# Patient Record
Sex: Female | Born: 1935
Health system: Southern US, Community
[De-identification: ages and names within clinical notes are randomized; demographics above are authoritative.]

## PROBLEM LIST (undated history)

## (undated) DIAGNOSIS — I480 Paroxysmal atrial fibrillation: Secondary | ICD-10-CM

## (undated) DIAGNOSIS — I10 Essential (primary) hypertension: Secondary | ICD-10-CM

## (undated) DIAGNOSIS — E785 Hyperlipidemia, unspecified: Secondary | ICD-10-CM

## (undated) DIAGNOSIS — M8580 Other specified disorders of bone density and structure, unspecified site: Secondary | ICD-10-CM

## (undated) HISTORY — DX: Essential (primary) hypertension: I10

## (undated) HISTORY — DX: Other specified disorders of bone density and structure, unspecified site: M85.80

## (undated) HISTORY — DX: Hyperlipidemia, unspecified: E78.5

## (undated) HISTORY — PX: TONSILLECTOMY: SUR1361

## (undated) HISTORY — DX: Paroxysmal atrial fibrillation: I48.0

---

## 1998-06-19 ENCOUNTER — Other Ambulatory Visit: Admission: RE | Admit: 1998-06-19 | Discharge: 1998-06-19 | Payer: Self-pay | Admitting: Obstetrics and Gynecology

## 1999-03-06 ENCOUNTER — Encounter: Admission: RE | Admit: 1999-03-06 | Discharge: 1999-04-16 | Payer: Self-pay | Admitting: Internal Medicine

## 1999-09-03 ENCOUNTER — Other Ambulatory Visit: Admission: RE | Admit: 1999-09-03 | Discharge: 1999-09-03 | Payer: Self-pay | Admitting: Obstetrics and Gynecology

## 2000-09-26 ENCOUNTER — Other Ambulatory Visit: Admission: RE | Admit: 2000-09-26 | Discharge: 2000-09-26 | Payer: Self-pay | Admitting: Obstetrics and Gynecology

## 2004-03-26 ENCOUNTER — Ambulatory Visit: Payer: Self-pay | Admitting: Internal Medicine

## 2005-07-21 ENCOUNTER — Ambulatory Visit: Payer: Self-pay | Admitting: Internal Medicine

## 2005-07-28 ENCOUNTER — Ambulatory Visit: Payer: Self-pay | Admitting: Gastroenterology

## 2005-08-11 ENCOUNTER — Ambulatory Visit: Payer: Self-pay | Admitting: Gastroenterology

## 2005-08-11 ENCOUNTER — Encounter (INDEPENDENT_AMBULATORY_CARE_PROVIDER_SITE_OTHER): Payer: Self-pay | Admitting: Specialist

## 2005-08-11 ENCOUNTER — Encounter: Payer: Self-pay | Admitting: Family Medicine

## 2006-03-31 ENCOUNTER — Ambulatory Visit: Payer: Self-pay | Admitting: Internal Medicine

## 2006-06-22 ENCOUNTER — Ambulatory Visit: Payer: Self-pay | Admitting: Internal Medicine

## 2007-01-09 ENCOUNTER — Encounter: Payer: Self-pay | Admitting: Family Medicine

## 2007-04-28 ENCOUNTER — Ambulatory Visit: Payer: Self-pay | Admitting: Internal Medicine

## 2007-04-28 DIAGNOSIS — E1169 Type 2 diabetes mellitus with other specified complication: Secondary | ICD-10-CM | POA: Insufficient documentation

## 2007-04-28 DIAGNOSIS — M810 Age-related osteoporosis without current pathological fracture: Secondary | ICD-10-CM | POA: Insufficient documentation

## 2007-04-28 DIAGNOSIS — E785 Hyperlipidemia, unspecified: Secondary | ICD-10-CM

## 2007-04-28 LAB — CONVERTED CEMR LAB: TSH: 1.28 microintl units/mL (ref 0.35–5.50)

## 2007-05-02 LAB — CONVERTED CEMR LAB: Vit D, 1,25-Dihydroxy: 30 (ref 30–89)

## 2007-10-27 ENCOUNTER — Encounter: Payer: Self-pay | Admitting: Internal Medicine

## 2008-02-20 ENCOUNTER — Ambulatory Visit: Payer: Self-pay | Admitting: Internal Medicine

## 2009-01-22 ENCOUNTER — Encounter: Payer: Self-pay | Admitting: Internal Medicine

## 2010-01-28 ENCOUNTER — Ambulatory Visit: Payer: Self-pay | Admitting: Family Medicine

## 2010-01-28 DIAGNOSIS — G4700 Insomnia, unspecified: Secondary | ICD-10-CM | POA: Insufficient documentation

## 2010-01-28 LAB — HM SIGMOIDOSCOPY

## 2010-02-04 ENCOUNTER — Encounter: Payer: Self-pay | Admitting: Family Medicine

## 2010-02-05 LAB — HM MAMMOGRAPHY: HM Mammogram: NORMAL

## 2010-02-13 ENCOUNTER — Encounter (INDEPENDENT_AMBULATORY_CARE_PROVIDER_SITE_OTHER): Payer: Self-pay | Admitting: *Deleted

## 2010-02-25 ENCOUNTER — Encounter: Payer: Self-pay | Admitting: Family Medicine

## 2010-03-04 ENCOUNTER — Encounter: Payer: Self-pay | Admitting: Family Medicine

## 2010-04-16 ENCOUNTER — Ambulatory Visit: Payer: Self-pay | Admitting: Family Medicine

## 2010-04-17 LAB — CONVERTED CEMR LAB
ALT: 17 units/L (ref 0–35)
Alkaline Phosphatase: 39 units/L (ref 39–117)
Bilirubin, Direct: 0.1 mg/dL (ref 0.0–0.3)
CO2: 31 meq/L (ref 19–32)
Chloride: 93 meq/L — ABNORMAL LOW (ref 96–112)
Sodium: 133 meq/L — ABNORMAL LOW (ref 135–145)
Total CHOL/HDL Ratio: 3
Total Protein: 6.6 g/dL (ref 6.0–8.3)
Vit D, 25-Hydroxy: 43 ng/mL (ref 30–89)

## 2010-04-24 ENCOUNTER — Ambulatory Visit: Payer: Self-pay | Admitting: Family Medicine

## 2010-04-24 DIAGNOSIS — R7303 Prediabetes: Secondary | ICD-10-CM

## 2010-04-24 DIAGNOSIS — E118 Type 2 diabetes mellitus with unspecified complications: Secondary | ICD-10-CM | POA: Insufficient documentation

## 2010-04-24 DIAGNOSIS — E871 Hypo-osmolality and hyponatremia: Secondary | ICD-10-CM | POA: Insufficient documentation

## 2010-05-08 ENCOUNTER — Ambulatory Visit: Payer: Self-pay | Admitting: Family Medicine

## 2010-05-14 ENCOUNTER — Ambulatory Visit
Admission: RE | Admit: 2010-05-14 | Discharge: 2010-05-14 | Payer: Self-pay | Source: Home / Self Care | Attending: Family Medicine | Admitting: Family Medicine

## 2010-05-14 ENCOUNTER — Encounter: Payer: Self-pay | Admitting: Family Medicine

## 2010-05-14 LAB — CONVERTED CEMR LAB
BUN: 13 mg/dL (ref 6–23)
CO2: 32 meq/L (ref 19–32)
Chloride: 93 meq/L — ABNORMAL LOW (ref 96–112)
Creatinine, Ser: 0.6 mg/dL (ref 0.4–1.2)

## 2010-05-19 LAB — CONVERTED CEMR LAB: Osmolality, Ur: 289 mOsm/kg — ABNORMAL LOW (ref 390–1090)

## 2010-05-21 ENCOUNTER — Telehealth: Payer: Self-pay | Admitting: Family Medicine

## 2010-05-21 ENCOUNTER — Encounter: Payer: Self-pay | Admitting: Family Medicine

## 2010-05-21 ENCOUNTER — Ambulatory Visit
Admission: RE | Admit: 2010-05-21 | Discharge: 2010-05-21 | Payer: Self-pay | Source: Home / Self Care | Attending: Family Medicine | Admitting: Family Medicine

## 2010-06-18 NOTE — Letter (Signed)
Summary: Results Follow up Letter  [place] [facility]  [address] [place], [place] [address]   Phone: [phone]  Fax: [phone]    02/13/2010 MRN: [id number]     [facility] [address] [place], [place]  [address]    Dear Ms. Melecio,  The following are the results of your recent test(s):  Test         Result    Pap Smear:        Normal _____  Not Normal _____ Comments: ______________________________________________________ Cholesterol: LDL(Bad cholesterol):         Your goal is less than:         HDL (Good cholesterol):       Your goal is more than: Comments:  ______________________________________________________ Mammogram:        Normal _____  Not Normal _____ Comments:  ___________________________________________________________________ Hemoccult:        Normal _____  Not normal _______ Comments:    _____________________________________________________________________ Other Tests:Bone density: OSTEOPENIA: REPEAT IN 2 YEARS    We routinely do not discuss normal results over the telephone.  If you desire a copy of the results, or you have any questions about this information we can discuss them at your next office visit.   Sincerely,  Kerby Nora MD

## 2010-06-18 NOTE — Assessment & Plan Note (Signed)
Summary: cpx/dlo   Vital Signs:  Patient profile:   77 year old female Height:      63 inches Weight:      114.8 pounds BMI:     20.41 Temp:     97.9 degrees F oral Pulse rate:   84 / minute Pulse rhythm:   regular BP sitting:   110 / 70  (left arm) Cuff size:   regular  Vitals Entered By: Benny Lennert CMA Duncan Dull) (April 24, 2010 2:10 PM)  Vision Screening:      Vision Comments: wears glasses  Vision Entered By: Benny Lennert CMA Duncan Dull) (April 24, 2010 2:11 PM)  Hearing Screen  20db HL: Left  Right  Audiometry Comment: can hear whisper from across the room   Hearing Testing Entered By: Benny Lennert CMA Duncan Dull) (April 24, 2010 2:11 PM)   History of Present Illness: Chief complaint  I have personally reviewed the Medicare Annual Wellness questionnaire and have noted 1.   The patient's medical and social history 2.   Their use of alcohol, tobacco or illicit drugs 3.   Their current medications and supplements 4.   The patient's functional ability including ADL's, fall risks, home safety risks and hearing or visual             impairment. 5.   Diet and physical activities 6.   Evidence for depression or mood disorders   The patients weight, height, BMI and visual acuity have been recorded in the chart I have made referrals, counseling and provided education to the patient based review of the above and I have provided the pt with a written personalized care plan for preventive services.   Reviewed labs in detail with pt.   Low sodium  on labs.. will recheck.   She did fall several weeks ago..stepped wrong...mild ttp in left posterior neck   Preventive Screening-Counseling & Management  Alcohol-Tobacco     Alcohol drinks/day: 0     Smoking Status: never  Caffeine-Diet-Exercise     Diet Comments: healthy varied diet      Diet Counseling: not indicated; diet is assessed to be healthy     Type of exercise: stretching, weights, line dancing   Exercise (avg: min/session): 1 hour     Times/week: 5 days per week     Exercise Counseling: not indicated; exercise is adequate     Depression Counseling: not indicated; screening negative for depression  Problems Prior to Update: 1)  Abdominal Pain Other Specified Site  (ICD-789.09) 2)  Insomnia, Chronic  (ICD-307.42) 3)  Osteopenia  (ICD-733.90) 4)  Hyperlipidemia  (ICD-272.4)  Current Medications (verified): 1)  Caltrate 600+d 600-400 Mg-Unit  Tabs (Calcium Carbonate-Vitamin D) .... Two Times A Day 2)  Tylenol Extra Strength 500 Mg  Tabs (Acetaminophen) .... As Needed 3)  Alendronate Sodium 35 Mg Tabs (Alendronate Sodium) .Marland Kitchen.. 1 Tab By Mouth Week  Allergies: 1)  ! Penicillin V Potassium (Penicillin V Potassium) 2)  ! Doxycycline Hyclate (Doxycycline Hyclate) 3)  ! Erythromycin Base (Erythromycin Base)  Past History:  Past medical, surgical, family and social histories (including risk factors) reviewed, and no changes noted (except as noted below).  Past Medical History: Reviewed history from 04/28/2007 and no changes required. Hyperlipidemia Osteopenia  Past Surgical History: Reviewed history from 01/28/2010 and no changes required. Tonsillectomy  Family History: Reviewed history from 01/28/2010 and no changes required. fahter: emphesema, textile mills, CAD..MI age 40 mother: CVA age 64 brother: CAD.Marland Kitchenstent no cancer in  family  Social History: Reviewed history from 01/28/2010 and no changes required. Widow/Widower  Never Smoked Alcohol use-no Drug use-no Regular exercise-yes.Marland Kitchengoing to jazz class 4 days a week, daiyl walk Diet: fruits and veggies, milk, water  Review of Systems General:  Denies fatigue and fever. CV:  Denies chest pain or discomfort. Resp:  Denies shortness of breath. GI:  Denies abdominal pain, bloody stools, constipation, and diarrhea. GU:  Denies dysuria. Psych:  Denies anxiety and depression.  Physical Exam  General:  thin appearing  elderly female in NAd  Eyes:  No corneal or conjunctival inflammation noted. EOMI. Perrla. Funduscopic exam benign, without hemorrhages, exudates or papilledema. Vision grossly normal. Ears:  External ear exam shows no significant lesions or deformities.  Otoscopic examination reveals clear canals, tympanic membranes are intact bilaterally without bulging, retraction, inflammation or discharge. Hearing is grossly normal bilaterally. Nose:  External nasal examination shows no deformity or inflammation. Nasal mucosa are pink and moist without lesions or exudates. Mouth:  Oral mucosa and oropharynx without lesions or exudates.  Teeth in good repair. Neck:  no carotid bruit or thyromegaly no cervical or supraclavicular lymphadenopathy  Chest Wall:  No deformities, masses, or tenderness noted. Breasts:  No mass, nodules, thickening, tenderness, bulging, retraction, inflamation, nipple discharge or skin changes noted.   Lungs:  Normal respiratory effort, chest expands symmetrically. Lungs are clear to auscultation, no crackles or wheezes. Heart:  Normal rate and regular rhythm. S1 and S2 normal without gallop, murmur, click, rub or other extra sounds. Abdomen:  Bowel sounds positive,abdomen soft and non-tender without masses, organomegaly or hernias noted. Genitalia:  not indicated Msk:  No deformity or scoliosis noted of thoracic or lumbar spine.    Mild ttp over left trazezius, no cervical vertebra ttp Neg drop arm and impingement test Pulses:  R and L carotid,radial,femoral,dorsalis pedis and posterior tibial pulses are full and equal bilaterally Extremities:  no edema  Neurologic:  No cranial nerve deficits noted. Station and gait are normal. Plantar reflexes are down-going bilaterally. DTRs are symmetrical throughout. Sensory, motor and coordinative functions appear intact.  Cognitive eval: MMSE 30/30 Skin:  no worrisome lesions Psych:  Cognition and judgment appear intact. Alert and cooperative  with normal attention span and concentration. No apparent delusions, illusions, hallucinations    Impression & Recommendations:  Problem # 1:  Preventive Health Care (ICD-V70.0) .The patient's preventative maintenance and recommended screening tests for an annual wellness exam were reviewed in full today. Brought up to date unless services declined.  Counselled on the importance of diet, exercise, and its role in overall health and mortality. The patient's FH and SH was reviewed, including their home life, tobacco status, and drug and alcohol status.     Problem # 2:  PREDIABETES (ICD-790.29) Assessment: New  Encouraged exercise, weight loss, healthy eating habits. Recheck in 6 months.   Labs Reviewed: Creat: 0.7 (04/16/2010)     Problem # 3:  HYPERLIPIDEMIA (ICD-272.4)  Well controlled with diet and lifestyle. on n med.   Labs Reviewed: SGOT: 20 (04/16/2010)   SGPT: 17 (04/16/2010)   HDL:62.10 (04/16/2010)  LDL:114 (04/16/2010)  Chol:192 (04/16/2010)  Trig:81.0 (04/16/2010)  Problem # 4:  HYPONATREMIA, MILD (ICD-276.1) Uncelar cause.. recheck in 2 weeks.   Complete Medication List: 1)  Caltrate 600+d 600-400 Mg-unit Tabs (Calcium carbonate-vitamin d) .... Two times a day 2)  Tylenol Extra Strength 500 Mg Tabs (Acetaminophen) .... As needed 3)  Alendronate Sodium 35 Mg Tabs (Alendronate sodium) .Marland Kitchen.. 1 tab by mouth week  Other Orders: Medicare -1st Annual Wellness Visit [phone]) Pneumococcal Vaccine (62952) Admin 1st Vaccine (84132) Zoster (Shingles) Vaccine Live [phone]) Admin of Any Addtl Vaccine (27253)  Patient Instructions: 1)  I have provided you with a copy of your personalized plan for preventive services. Please take the time to review along with your updated medication list.  2)   Stop salt restriction. 3)  Return for BMET in 2 weeks Dx 276.1 4)   Decrease sweets and carbs in diet, decrease juice. 5)  Please schedule a follow-up appointment in 6 months .  6)   BMP prior to visit, ICD-9: 790.29 7)  Hepatic Panel prior to visit ICD-9:    Orders Added: 1)  Medicare -1st Annual Wellness Visit [G0438] 2)  Est. Patient Level III [66440] 3)  Pneumococcal Vaccine [90732] 4)  Admin 1st Vaccine [90471] 5)  Zoster (Shingles) Vaccine Live [90736] 6)  Admin of Any Addtl Vaccine [34742]   Immunizations Administered:  Pneumonia Vaccine:    Vaccine Type: Pneumovax (Medicare)    Site: left deltoid    Mfr: Merck    Dose: 0.5 ml    Route: IM    Given by: Benny Lennert CMA (AAMA)    Exp. Date: 09/11/2011    Lot #: [id number]    VIS given: 04/21/09 version given April 24, 2010.  Zostavax # 1:    Vaccine Type: Zostavax    Site: right deltoid    Mfr: Merck    Dose: 0.5 ml    Route: IM    Given by: Benny Lennert CMA (AAMA)    Exp. Date: 01/02/2011    Lot #: [id number]    VIS given: 02/26/05 given April 24, 2010.   Immunizations Administered:  Pneumonia Vaccine:    Vaccine Type: Pneumovax (Medicare)    Site: left deltoid    Mfr: Merck    Dose: 0.5 ml    Route: IM    Given by: Benny Lennert CMA (AAMA)    Exp. Date: 09/11/2011    Lot #: [id number]    VIS given: 04/21/09 version given April 24, 2010.  Zostavax # 1:    Vaccine Type: Zostavax    Site: right deltoid    Mfr: Merck    Dose: 0.5 ml    Route: IM    Given by: Benny Lennert CMA (AAMA)    Exp. Date: 01/02/2011    Lot #: [id number]    VIS given: 02/26/05 given April 24, 2010.  Current Allergies (reviewed today): ! PENICILLIN V POTASSIUM (PENICILLIN V POTASSIUM) ! DOXYCYCLINE HYCLATE (DOXYCYCLINE HYCLATE) ! ERYTHROMYCIN BASE (ERYTHROMYCIN BASE)  Hemoccult Next Due:  Not Indicated PAP Next Due:  Not Indicated Last Bone Density:  stable osteoporosis on bisphosphonate (03/15/2010 10:10:55 PM) Bone Density Result Date:  04/17/2010 Bone Density Result:  Corrrection..stable osteoPENIA

## 2010-06-18 NOTE — Assessment & Plan Note (Signed)
Summary: TRANSFER FROM [facility]   Vital Signs:  Patient profile:   77 year old female Height:      63 inches Weight:      111 pounds BMI:     19.73 Temp:     99.0 degrees F oral Pulse rate:   84 / minute Pulse rhythm:   regular BP sitting:   120 / 60  (left arm) Cuff size:   regular  Vitals Entered By: Benny Lennert CMA Duncan Dull) (January 28, 2010 9:40 AM)  History of Present Illness: Chief complaint new patient transfer from [place]  Recently moved to area from [place].  High cholesterol.. not checked in a while  Osteopenia seen on DXA last year...started on fosamax 1 year ago...not sure when last DXA. Had seen Dr. Ambrose Mantle in [place] for GYN.  Preventive Screening-Counseling & Management  Alcohol-Tobacco     Smoking Status: never  Caffeine-Diet-Exercise     Does Patient Exercise: yes      Drug Use:  no.    Problems Prior to Update: 1)  Osteopenia  (ICD-733.90) 2)  Hyperlipidemia  (ICD-272.4)  Allergies: 1)  ! Penicillin V Potassium (Penicillin V Potassium) 2)  ! Doxycycline Hyclate (Doxycycline Hyclate) 3)  ! Erythromycin Base (Erythromycin Base)  Past History:  Past medical, surgical, family and social histories (including risk factors) reviewed, and no changes noted (except as noted below).  Past Medical History: Reviewed history from 04/28/2007 and no changes required. Hyperlipidemia Osteopenia  Past Surgical History: Tonsillectomy  Family History: Reviewed history and no changes required. fahter: emphesema, textile mills, CAD..MI age 85 mother: CVA age 84 brother: CAD.Marland Kitchenstent no cancer in family  Social History: Reviewed history and no changes required. Widow/Widower  Never Smoked Alcohol use-no Drug use-no Regular exercise-yes.Marland Kitchengoing to jazz class 4 days a week, daiyl walk Diet: fruits and veggies, milk, water Drug Use:  no Does Patient Exercise:  yes  Review of Systems General:  Denies fever. ENT:  Complains of ear discharge  and nasal congestion. CV:  Denies chest pain or discomfort. Resp:  Denies shortness of breath. GI:  Denies abdominal pain, bloody stools, and indigestion. GU:  Denies dysuria; occ itchy spot vaginally in last few weeks. Marland Kitchen  Physical Exam  General:  elderly female IN NAD Eyes:  No corneal or conjunctival inflammation noted. EOMI. Perrla. Funduscopic exam benign, without hemorrhages, exudates or papilledema. Vision grossly normal. Ears:  External ear exam shows no significant lesions or deformities.  Otoscopic examination reveals clear canals, tympanic membranes are intact bilaterally without bulging, retraction, inflammation or discharge. Hearing is grossly normal bilaterally. Nose:  External nasal examination shows no deformity or inflammation. Nasal mucosa are pink and moist without lesions or exudates. Mouth:  Oral mucosa and oropharynx without lesions or exudates.  Teeth in moderate repair Neck:  no carotid bruit or thyromegaly no cervical or supraclavicular lymphadenopathy  Lungs:  Normal respiratory effort, chest expands symmetrically. Lungs are clear to auscultation, no crackles or wheezes. Heart:  Normal rate and regular rhythm. S1 and S2 normal without gallop, murmur, click, rub or other extra sounds. Abdomen:  Bowel sounds positive,abdomen soft and non-tender without masses, organomegaly or hernias noted. Pulses:  R and L posterior tibial pulses are full and equal bilaterally  Extremities:  no edema Skin:  Intact without suspicious lesions or rashes Psych:  Cognition and judgment appear intact. Alert and cooperative with normal attention span and concentration. No apparent delusions, illusions, hallucinations   Impression & Recommendations:  Problem # 1:  OSTEOPENIA (ICD-733.90)  Will try to get last DXA result from Dr. Ambrose Mantle...continue fosamax 35 mg daily...likely continue until 2 year check due to reeval.  Her updated medication list for this problem includes:    Caltrate 600+d  600-400 Mg-unit Tabs (Calcium carbonate-vitamin d) .Marland Kitchen..Marland Kitchen Two times a day    Vitamin D 82956 Unit Caps (Ergocalciferol) ..... One by mouth weekly    Alendronate Sodium 35 Mg Tabs (Alendronate sodium) .Marland Kitchen... 1 tab by mouth week  Problem # 2:  HYPERLIPIDEMIA (ICD-272.4) Due for reeval.   Problem # 3:  INSOMNIA, CHRONIC (ICD-307.42) Counseled on healthy sleep, info given, start melatonin...stop benadryl for sleep given inefective and associated SE.   Problem # 4:  Preventive Health Care (ICD-V70.0) Assessment: Comment Only Obtain old records to determine when last colonoscpy, ? next due, last DXA and if she has had pneumovax.  Complete Medication List: 1)  Caltrate 600+d 600-400 Mg-unit Tabs (Calcium carbonate-vitamin d) .... Two times a day 2)  Tylenol Extra Strength 500 Mg Tabs (Acetaminophen) .... As needed 3)  Vitamin D 21308 Unit Caps (Ergocalciferol) .... One by mouth weekly 4)  Alendronate Sodium 35 Mg Tabs (Alendronate sodium) .Marland Kitchen.. 1 tab by mouth week  Patient Instructions: 1)  Fasting LIPIDs, CMET  Dx 272.0, TSH, vit B12 789.09, vit D Dx 733.0 2)   Schedule CPX in next 1-2 months. 3)   Call insurance first about shingle vaccine. 4)   Look into you records about colonoscopy and pneumonia vaccine. 5)   May melatonin 3 mg for sleep. 6)   Stop other sleep aid. 7)   Look into sleep hygeine information.   Prescriptions: ALENDRONATE SODIUM 35 MG TABS (ALENDRONATE SODIUM) 1 tab by mouth week  #12 x 0   Entered and Authorized by:   Kerby Nora MD   Signed by:   Kerby Nora MD on 01/28/2010   Method used:   Electronically to        [facility]. [address] [phone]* (retail)       [address] [place], [place]  [id number]       Ph: [phone]       Fax: [phone]   RxID:   [id number]   Current Allergies (reviewed today): ! PENICILLIN V POTASSIUM (PENICILLIN V POTASSIUM) ! DOXYCYCLINE HYCLATE (DOXYCYCLINE HYCLATE) ! ERYTHROMYCIN BASE (ERYTHROMYCIN BASE)  Last Flu  Vaccine:  Fluvax 3+ (02/20/2008 3:55:37 PM) Flu Vaccine Result Date:  01/28/2010 Flu Vaccine Result:  given Flu Vaccine Next Due:  1 yr Flex Sig Next Due:  Not Indicated Colonoscopy Result Date:  05/17/2006 Colonoscopy Result:  normal Mammogram Result Date:  01/15/2009 Mammogram Result:  normal Mammogram Next Due:  1 yr    Past Medical History:    Reviewed history from 04/28/2007 and no changes required:       Hyperlipidemia       Osteopenia  Past Surgical History:    Reviewed history and no changes required:       Tonsillectomy      Appended Document: Orders Update    Clinical Lists Changes  Observations: Added new observation of COLONNXTDUE: 08/12/2010 (01/28/2010 16:40) Removed observation of COLONOSCOPY: normal (05/17/2006 10:46) Added new observation of LST COLON DT: 08/11/2005 (08/11/2005 16:41) Added new observation of COLONOSCOPY: adenomatous polyps (08/11/2005 16:41)      Colonoscopy Result Date:  08/11/2005 Colonoscopy Result:  adenomatous polyps Colonoscopy Next Due:  5 yr

## 2010-06-18 NOTE — Progress Notes (Signed)
Summary: swelling in her legs  Phone Note Call from Patient Call back at Home Phone [phone]   Caller: Patient Call For: Meredith Nora MD Summary of Call: Patient is calling to let you know that she has had some swelling in her legs. She says that she was told by Dr. Ermalene Searing to call if she noticed any swelling.  Initial call taken by: Melody Comas,  May 21, 2010 11:48 AM  Follow-up for Phone Call        I am not sure how to triage this -- Dr. Ermalene Searing is in town today, back to work tomororw. I am going to forward to her, I don't think it is emergent at least.  Follow-up by: Hannah Beat MD,  May 21, 2010 11:54 AM  Additional Follow-up for Phone Call Additional follow up Details #1::        Noted. Will review lab results today and get back to her by end of day.  Additional Follow-up by: Meredith Nora MD,  May 22, 2010 8:24 AM

## 2010-06-18 NOTE — Letter (Signed)
Summary: Nature conservation officer [employer] Wellness Visit Questionnaire   [employer] Annual Wellness Visit Questionnaire   Imported By: Beau Fanny 04/27/2010 10:49:38  _____________________________________________________________________  External Attachment:    Type:   Image     Comment:   External Document

## 2010-06-18 NOTE — Procedures (Signed)
Summary: Colonoscopy by Dr.Malcolm Stark,[facility]  Colonoscopy by Dr.Malcolm Stark,[facility]   Imported By: Beau Fanny 01/29/2010 09:23:54  _____________________________________________________________________  External Attachment:    Type:   Image     Comment:   External Document

## 2010-07-29 ENCOUNTER — Encounter: Payer: Self-pay | Admitting: Gastroenterology

## 2010-08-04 NOTE — Letter (Signed)
Summary: Colonoscopy Letter  [facility]  [address] [place], [place] [address]   Phone: [phone]  Fax: [phone]      July 29, 2010 MRN: [id number]   [facility] [address] [place], [place]  [address]   Dear Meredith Kramer,   According to your medical record, it is time for you to schedule a Colonoscopy. The American Cancer Society recommends this procedure as a method to detect early colon cancer. Patients with a family history of colon cancer, or a personal history of colon polyps or inflammatory bowel disease are at increased risk.  This letter has been generated based on the recommendations made at the time of your procedure. If you feel that in your particular situation this may no longer apply, please contact our office.  Please call our office at [phone] to schedule this appointment or to update your records at your earliest convenience.  Thank you for cooperating with [place] to provide you with the very best care possible.   Sincerely,  Judie Petit T. Russella Dar, M.D.  [facility] [phone]

## 2010-09-01 ENCOUNTER — Other Ambulatory Visit: Payer: Self-pay | Admitting: Family Medicine

## 2010-10-20 ENCOUNTER — Other Ambulatory Visit (INDEPENDENT_AMBULATORY_CARE_PROVIDER_SITE_OTHER): Payer: Medicare Other | Admitting: Family Medicine

## 2010-10-20 DIAGNOSIS — R7309 Other abnormal glucose: Secondary | ICD-10-CM

## 2010-10-20 LAB — BASIC METABOLIC PANEL
BUN: 16 mg/dL (ref 6–23)
Chloride: 99 mEq/L (ref 96–112)
Creatinine, Ser: 0.7 mg/dL (ref 0.4–1.2)

## 2010-10-20 LAB — HEPATIC FUNCTION PANEL
ALT: 14 U/L (ref 0–35)
Albumin: 4.1 g/dL (ref 3.5–5.2)
Total Bilirubin: 0.7 mg/dL (ref 0.3–1.2)

## 2010-10-24 ENCOUNTER — Encounter: Payer: Self-pay | Admitting: Family Medicine

## 2010-10-27 ENCOUNTER — Encounter: Payer: Self-pay | Admitting: Family Medicine

## 2010-10-27 ENCOUNTER — Ambulatory Visit (INDEPENDENT_AMBULATORY_CARE_PROVIDER_SITE_OTHER): Payer: Medicare Other | Admitting: Family Medicine

## 2010-10-27 DIAGNOSIS — R7309 Other abnormal glucose: Secondary | ICD-10-CM

## 2010-10-27 DIAGNOSIS — I8390 Asymptomatic varicose veins of unspecified lower extremity: Secondary | ICD-10-CM

## 2010-10-27 DIAGNOSIS — E871 Hypo-osmolality and hyponatremia: Secondary | ICD-10-CM

## 2010-10-27 DIAGNOSIS — E785 Hyperlipidemia, unspecified: Secondary | ICD-10-CM

## 2010-10-27 NOTE — Patient Instructions (Addendum)
Wear compression hose 15-30 mmHG on legs. Elevate legs above heart. Regular exercise. Stop orange juice, can drink no sugar added cranberry juice occasionally. Avoid sweets, decrease carbohydrates. Eat lean protein and a lot of leafy green veggies.

## 2010-10-27 NOTE — Assessment & Plan Note (Signed)
Due for reeval in 6 months, last check well controlled

## 2010-10-27 NOTE — Assessment & Plan Note (Signed)
Resolved. Serum osmolality was normal.. So was likely an artifact.

## 2010-10-27 NOTE — Assessment & Plan Note (Signed)
Elevated feet, exercise, compression hose.

## 2010-10-27 NOTE — Progress Notes (Signed)
  Subjective:    Patient ID: Meredith Kramer, female    DOB: [birth year], 77 y.o.   MRN: [id number]  HPI  Feeling well overall.   Elevated Cholesterol: At goal last check   B lower legs swelling.Marland Kitchen Has improved some. She has significant varicose veins.  Hyponatremia, resolved.  Prediabetes, continued, slightly worse. Eats some carbs, drinks crystal light.  OJ every morning.   Review of Systems  Constitutional: Negative for fever and fatigue.  HENT: Negative for ear pain.   Eyes: Negative for pain.  Respiratory: Negative for shortness of breath.   Cardiovascular: Negative for chest pain, palpitations and leg swelling.  Gastrointestinal: Negative for abdominal pain, diarrhea and constipation.  Genitourinary: Negative for dysuria.  Neurological: Positive for headaches.       Sharp pain momentary  Few days ago, none now       Objective:   Physical Exam  Constitutional: Vital signs are normal. She appears well-developed and well-nourished. She is cooperative.  Non-toxic appearance. She does not appear ill. No distress.  HENT:  Head: Normocephalic.  Right Ear: Hearing, tympanic membrane, external ear and ear canal normal. Tympanic membrane is not erythematous, not retracted and not bulging.  Left Ear: Hearing, tympanic membrane, external ear and ear canal normal. Tympanic membrane is not erythematous, not retracted and not bulging.  Nose: No mucosal edema or rhinorrhea. Right sinus exhibits no maxillary sinus tenderness and no frontal sinus tenderness. Left sinus exhibits no maxillary sinus tenderness and no frontal sinus tenderness.  Mouth/Throat: Uvula is midline, oropharynx is clear and moist and mucous membranes are normal.  Eyes: Conjunctivae, EOM and lids are normal. Pupils are equal, round, and reactive to light. No foreign bodies found.  Neck: Trachea normal and normal range of motion. Neck supple. Carotid bruit is not present. No mass and no thyromegaly present.    Cardiovascular: Normal rate, regular rhythm, S1 normal, S2 normal, normal heart sounds, intact distal pulses and normal pulses.  Exam reveals no gallop and no friction rub.   No murmur heard.      No current peripheral edema B moderate varicose veins on feet, calves and thighs  Pulmonary/Chest: Effort normal and breath sounds normal. Not tachypneic. No respiratory distress. She has no decreased breath sounds. She has no wheezes. She has no rhonchi. She has no rales.  Abdominal: Soft. Normal appearance and bowel sounds are normal. There is no tenderness.  Neurological: She is alert.  Skin: Skin is warm, dry and intact. No rash noted.  Psychiatric: Her speech is normal and behavior is normal. Judgment and thought content normal. Her mood appears not anxious. Cognition and memory are normal. She does not exhibit a depressed mood.          Assessment & Plan:

## 2010-10-27 NOTE — Assessment & Plan Note (Signed)
Worsened control. Counsled on lifestyle and diet changes. Recehck in 6 months.

## 2010-11-30 ENCOUNTER — Other Ambulatory Visit: Payer: Self-pay | Admitting: Family Medicine

## 2011-03-02 ENCOUNTER — Other Ambulatory Visit: Payer: Self-pay | Admitting: Family Medicine

## 2011-04-26 ENCOUNTER — Other Ambulatory Visit (INDEPENDENT_AMBULATORY_CARE_PROVIDER_SITE_OTHER): Payer: Medicare Other

## 2011-04-26 ENCOUNTER — Telehealth: Payer: Self-pay | Admitting: Family Medicine

## 2011-04-26 DIAGNOSIS — E785 Hyperlipidemia, unspecified: Secondary | ICD-10-CM

## 2011-04-26 DIAGNOSIS — R7309 Other abnormal glucose: Secondary | ICD-10-CM

## 2011-04-26 LAB — COMPREHENSIVE METABOLIC PANEL
AST: 17 U/L (ref 0–37)
Albumin: 4 g/dL (ref 3.5–5.2)
Alkaline Phosphatase: 39 U/L (ref 39–117)
BUN: 12 mg/dL (ref 6–23)
Creatinine, Ser: 0.7 mg/dL (ref 0.4–1.2)
Potassium: 4.1 mEq/L (ref 3.5–5.1)
Total Bilirubin: 0.5 mg/dL (ref 0.3–1.2)

## 2011-04-26 LAB — LIPID PANEL
HDL: 57.9 mg/dL (ref >39.00)
LDL Cholesterol: 108 mg/dL — ABNORMAL HIGH (ref 0–99)
Total CHOL/HDL Ratio: 3
Triglycerides: 111 mg/dL (ref 0.0–149.0)
VLDL: 22.2 mg/dL (ref 0.0–40.0)

## 2011-04-26 NOTE — Telephone Encounter (Signed)
Message copied by Excell Seltzer on Mon Apr 26, 2011  9:13 AM ------      Message from: Alvina Chou      Created: Tue Apr 20, 2011 11:14 AM      Regarding: orders for 12-10       Patient is scheduled for CPX labs, please order future labs, Thanks , Camelia Eng

## 2011-04-30 ENCOUNTER — Encounter: Payer: Medicare Other | Admitting: Family Medicine

## 2011-05-14 ENCOUNTER — Encounter: Payer: Medicare Other | Admitting: Family Medicine

## 2011-05-26 ENCOUNTER — Encounter: Payer: Self-pay | Admitting: Family Medicine

## 2011-05-26 ENCOUNTER — Ambulatory Visit (INDEPENDENT_AMBULATORY_CARE_PROVIDER_SITE_OTHER): Payer: Medicare Other | Admitting: Family Medicine

## 2011-05-26 VITALS — BP 110/60 | HR 60 | Temp 97.4°F | Ht 63.0 in | Wt 115.5 lb

## 2011-05-26 DIAGNOSIS — M899 Disorder of bone, unspecified: Secondary | ICD-10-CM | POA: Diagnosis not present

## 2011-05-26 DIAGNOSIS — R7309 Other abnormal glucose: Secondary | ICD-10-CM | POA: Diagnosis not present

## 2011-05-26 DIAGNOSIS — E785 Hyperlipidemia, unspecified: Secondary | ICD-10-CM | POA: Diagnosis not present

## 2011-05-26 DIAGNOSIS — Z0000 Encounter for general adult medical examination without abnormal findings: Secondary | ICD-10-CM

## 2011-05-26 NOTE — Assessment & Plan Note (Signed)
Well controlled with diet. 

## 2011-05-26 NOTE — Progress Notes (Signed)
Subjective:    Patient ID: Meredith Kramer, female    DOB: January 30, 1930, 78 y.o.   MRN: [id number]  HPI I have personally reviewed the Medicare Annual Wellness questionnaire and have noted 1. The patient's medical and social history 2. Their use of alcohol, tobacco or illicit drugs 3. Their current medications and supplements 4. The patient's functional ability including ADL's, fall risks, home safety risks and hearing or visual             impairment. 5. Diet and physical activities 6. Evidence for depression or mood disorders The patients weight, height, BMI and visual acuity have been recorded in the chart I have made referrals, counseling and provided education to the patient based review of the above and I have provided the pt with a written personalized care plan for preventive services.  ADLs on her own. No assitive device.  Prediabetes: Inadequate control.. Glucose fasting at 118  Elevated Cholesterol:  LDL at goal <130 on no medicaiton. 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 Other complaints:  Mild hyponatremia has returned. On no med that causes.     Review of Systems  Constitutional: Negative for fever, fatigue and unexpected weight change.  HENT: Negative for ear pain, congestion, sore throat, sneezing, trouble swallowing and sinus pressure.   Eyes: Negative for pain and itching.  Respiratory: Negative for cough, shortness of breath and wheezing.   Cardiovascular: Negative for chest pain, palpitations and leg swelling.  Gastrointestinal: Negative for nausea, abdominal pain, diarrhea, constipation and blood in stool.  Genitourinary: Negative for dysuria, hematuria, vaginal discharge, difficulty urinating and menstrual problem.  Skin: Negative for rash.  Neurological: Negative for syncope, weakness, light-headedness, numbness and headaches.       No  recent falls  Psychiatric/Behavioral: Negative for confusion and dysphoric mood. The patient is not nervous/anxious.    Clearing throat, mild cough... No heartburn, mild runny nose no allergy symptoms. Not interesetd in treatment.    Objective:   Physical Exam  Constitutional: Vital signs are normal. She appears well-developed and well-nourished. She is cooperative.  Non-toxic appearance. She does not appear ill. No distress.       Elderly thin appearing female in NAD.  HENT:  Head: Normocephalic.  Right Ear: Hearing, tympanic membrane, external ear and ear canal normal.  Left Ear: Hearing, tympanic membrane, external ear and ear canal normal.  Nose: Nose normal.  Eyes: Conjunctivae, EOM and lids are normal. Pupils are equal, round, and reactive to light. No foreign bodies found.  Neck: Trachea normal and normal range of motion. Neck supple. Carotid bruit is not present. No mass and no thyromegaly present.  Cardiovascular: Normal rate, regular rhythm, S1 normal, S2 normal, normal heart sounds and intact distal pulses.  Exam reveals no gallop.   No murmur heard. Pulmonary/Chest: Effort normal and breath sounds normal. No respiratory distress. She has no wheezes. She has no rhonchi. She has no rales.  Abdominal: Soft. Normal appearance and bowel sounds are normal. She exhibits no distension, no fluid wave, no abdominal bruit and no mass. There is no hepatosplenomegaly. There is no tenderness. There is no rebound, no guarding and no CVA tenderness. No hernia.  Genitourinary: No breast swelling, tenderness, discharge or bleeding. Pelvic exam was performed with patient prone.  Lymphadenopathy:    She has no cervical adenopathy.    She has no axillary adenopathy.  Neurological: She is  alert. She has normal strength. No cranial nerve deficit or sensory deficit.  Skin: Skin is warm, dry and intact. No rash noted.  Psychiatric: Her speech is normal and behavior is normal. Judgment normal. Her mood  appears not anxious. Cognition and memory are normal. She does not exhibit a depressed mood.          Assessment & Plan:  AMW: The patient's preventative maintenance and recommended screening tests for an annual wellness exam were reviewed in full today. Brought up to date unless services declined.  Counselled on the importance of diet, exercise, and its role in overall health and mortality. The patient's FH and SH was reviewed, including their home life, tobacco status, and drug and alcohol status.   Vaccines: uptodate with Td, PNA and shingles vaccines, Flu 02/2011  DEXA: Osteopenia on fosamax stable 02/2010  Mammo: nml 01/2010.. Due for repeat Colon: 07/2005 Dr. Russella Dar...adenomatous polyps... Repeat due now Pap/DVE: not indicated Nonsmoker

## 2011-05-26 NOTE — Patient Instructions (Addendum)
Work on low carbohydrate diet... Increase fiber, use whole wheat bread.  Okay to use crystal light. Avoid fruit juice. Don't forget to call [facility] to schedule mammogram. NO bone density needed. Colon cancer screening: call Dr. Ardell Isaacs office to schedule.

## 2011-05-26 NOTE — Assessment & Plan Note (Signed)
Inadequate control...discussed diet changes in detail. Continue regular exercise.

## 2011-05-26 NOTE — Assessment & Plan Note (Signed)
On fosamax 3-4 years...stable DEXA 2011, consider stopping fosamax in 2014.

## 2011-06-15 ENCOUNTER — Other Ambulatory Visit: Payer: Self-pay | Admitting: Family Medicine

## 2011-09-07 DIAGNOSIS — H04129 Dry eye syndrome of unspecified lacrimal gland: Secondary | ICD-10-CM | POA: Diagnosis not present

## 2011-09-07 DIAGNOSIS — H01009 Unspecified blepharitis unspecified eye, unspecified eyelid: Secondary | ICD-10-CM | POA: Diagnosis not present

## 2011-09-07 DIAGNOSIS — I10 Essential (primary) hypertension: Secondary | ICD-10-CM | POA: Diagnosis not present

## 2011-09-07 DIAGNOSIS — H3500 Unspecified background retinopathy: Secondary | ICD-10-CM | POA: Diagnosis not present

## 2011-09-09 ENCOUNTER — Other Ambulatory Visit: Payer: Self-pay | Admitting: Family Medicine

## 2012-02-02 ENCOUNTER — Encounter: Payer: Self-pay | Admitting: Gastroenterology

## 2012-02-17 DIAGNOSIS — Z1231 Encounter for screening mammogram for malignant neoplasm of breast: Secondary | ICD-10-CM | POA: Diagnosis not present

## 2012-02-18 ENCOUNTER — Encounter: Payer: Self-pay | Admitting: Family Medicine

## 2012-02-22 ENCOUNTER — Encounter: Payer: Self-pay | Admitting: Family Medicine

## 2012-03-01 DIAGNOSIS — Z23 Encounter for immunization: Secondary | ICD-10-CM | POA: Diagnosis not present

## 2012-09-05 DIAGNOSIS — H04129 Dry eye syndrome of unspecified lacrimal gland: Secondary | ICD-10-CM | POA: Diagnosis not present

## 2012-09-05 DIAGNOSIS — H43819 Vitreous degeneration, unspecified eye: Secondary | ICD-10-CM | POA: Diagnosis not present

## 2012-09-05 DIAGNOSIS — H01009 Unspecified blepharitis unspecified eye, unspecified eyelid: Secondary | ICD-10-CM | POA: Diagnosis not present

## 2012-09-05 DIAGNOSIS — H2510 Age-related nuclear cataract, unspecified eye: Secondary | ICD-10-CM | POA: Diagnosis not present

## 2012-11-07 ENCOUNTER — Other Ambulatory Visit: Payer: Self-pay | Admitting: Family Medicine

## 2012-11-24 ENCOUNTER — Other Ambulatory Visit (INDEPENDENT_AMBULATORY_CARE_PROVIDER_SITE_OTHER): Payer: Medicare Other

## 2012-11-24 ENCOUNTER — Telehealth: Payer: Self-pay | Admitting: Family Medicine

## 2012-11-24 DIAGNOSIS — M899 Disorder of bone, unspecified: Secondary | ICD-10-CM | POA: Diagnosis not present

## 2012-11-24 DIAGNOSIS — E871 Hypo-osmolality and hyponatremia: Secondary | ICD-10-CM | POA: Diagnosis not present

## 2012-11-24 DIAGNOSIS — R7309 Other abnormal glucose: Secondary | ICD-10-CM

## 2012-11-24 DIAGNOSIS — E785 Hyperlipidemia, unspecified: Secondary | ICD-10-CM | POA: Diagnosis not present

## 2012-11-24 DIAGNOSIS — M949 Disorder of cartilage, unspecified: Secondary | ICD-10-CM | POA: Diagnosis not present

## 2012-11-24 LAB — COMPREHENSIVE METABOLIC PANEL
Alkaline Phosphatase: 39 U/L (ref 39–117)
BUN: 13 mg/dL (ref 6–23)
CO2: 33 mEq/L — ABNORMAL HIGH (ref 19–32)
GFR: 89.86 mL/min (ref >60.00)
Glucose, Bld: 114 mg/dL — ABNORMAL HIGH (ref 70–99)
Total Bilirubin: 0.6 mg/dL (ref 0.3–1.2)

## 2012-11-24 LAB — LIPID PANEL
Cholesterol: 192 mg/dL (ref 0–200)
LDL Cholesterol: 112 mg/dL — ABNORMAL HIGH (ref 0–99)
Triglycerides: 84 mg/dL (ref 0.0–149.0)
VLDL: 16.8 mg/dL (ref 0.0–40.0)

## 2012-11-24 NOTE — Telephone Encounter (Signed)
Message copied by Kerby Nora E on Fri Nov 24, 2012 12:53 AM ------      Message from: Alvina Chou      Created: Wed Nov 15, 2012 11:42 AM      Regarding: Lab orders for Friday, 7.11.14       Patient is scheduled for CPX labs, please order future labs, Thanks , Meredith Kramer       ------

## 2012-12-01 ENCOUNTER — Ambulatory Visit (INDEPENDENT_AMBULATORY_CARE_PROVIDER_SITE_OTHER): Payer: Medicare Other | Admitting: Family Medicine

## 2012-12-01 ENCOUNTER — Encounter: Payer: Self-pay | Admitting: Family Medicine

## 2012-12-01 VITALS — BP 120/70 | HR 67 | Temp 97.9°F | Ht 63.0 in | Wt 116.5 lb

## 2012-12-01 DIAGNOSIS — M949 Disorder of cartilage, unspecified: Secondary | ICD-10-CM

## 2012-12-01 DIAGNOSIS — M899 Disorder of bone, unspecified: Secondary | ICD-10-CM

## 2012-12-01 DIAGNOSIS — Z0000 Encounter for general adult medical examination without abnormal findings: Secondary | ICD-10-CM

## 2012-12-01 DIAGNOSIS — R7309 Other abnormal glucose: Secondary | ICD-10-CM

## 2012-12-01 DIAGNOSIS — E785 Hyperlipidemia, unspecified: Secondary | ICD-10-CM

## 2012-12-01 NOTE — Assessment & Plan Note (Signed)
Well controlled on no med. 

## 2012-12-01 NOTE — Progress Notes (Signed)
HPI  I have personally reviewed the Medicare Annual Wellness questionnaire and have noted  1. The patient's medical and social history  2. Their use of alcohol, tobacco or illicit drugs  3. Their current medications and supplements  4. The patient's functional ability including ADL's, fall risks, home safety risks and hearing or visual  impairment.  5. Diet and physical activities  6. Evidence for depression or mood disorders   The patients weight, height, BMI and visual acuity have been recorded in the chart  I have made referrals, counseling and provided education to the patient based review of the above and I have provided the pt with a written personalized care plan for preventive services.   ADLs on her own. No assitive device.   Prediabetes: Inadequate control. Lab Results  Component Value Date   HGBA1C 6.3 11/24/2012     Elevated Cholesterol: LDL at goal <130 on no medicaiton.  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  Other complaints:   Mild hyponatremia has resolved.  Review of Systems  Constitutional: Negative for fever, fatigue and unexpected weight change.  HENT: Negative for ear pain, congestion, sore throat, sneezing, trouble swallowing and sinus pressure.  Eyes: Negative for pain and itching.  Respiratory: Negative for cough, shortness of breath and wheezing.  Cardiovascular: Negative for chest pain, palpitations and leg swelling.  Gastrointestinal: Negative for nausea, abdominal pain, diarrhea, constipation and blood in stool.  Genitourinary: Negative for dysuria, hematuria, vaginal discharge, difficulty urinating and menstrual problem.  Skin: Negative for rash.  Neurological: Negative for  syncope, weakness, light-headedness, numbness and headaches.  No recent falls  Psychiatric/Behavioral: Negative for confusion and dysphoric mood. The patient is not nervous/anxious.  Clearing throat, mild cough... No heartburn, mild runny nose no allergy symptoms.  Not interesetd in treatment.  Objective:   Physical Exam  Constitutional: Vital signs are normal. She appears well-developed and well-nourished. She is cooperative. Non-toxic appearance. She does not appear ill. No distress.  Elderly thin appearing female in NAD.  HENT:  Head: Normocephalic.  Right Ear: Hearing, tympanic membrane, external ear and ear canal normal.  Left Ear: Hearing, tympanic membrane, external ear and ear canal normal.  Nose: Nose normal.  Eyes: Conjunctivae, EOM and lids are normal. Pupils are equal, round, and reactive to light. No foreign bodies found.  Neck: Trachea normal and normal range of motion. Neck supple. Carotid bruit is not present. No mass and no thyromegaly present.  Cardiovascular: Normal rate, regular rhythm, S1 normal, S2 normal, normal heart sounds and intact distal pulses. Exam reveals no gallop.  No murmur heard.  Pulmonary/Chest: Effort normal and breath sounds normal. No respiratory distress. She has no wheezes. She has no rhonchi. She has no rales.  Abdominal: Soft. Normal appearance and bowel sounds are normal. She exhibits no distension, no fluid wave, no abdominal bruit and no mass. There is no hepatosplenomegaly. There is no tenderness. There is no rebound, no guarding and no CVA tenderness. No hernia.  Genitourinary: No breast swelling, tenderness, discharge or bleeding. Pelvic exam was performed with patient prone.  Lymphadenopathy:  She has no cervical adenopathy.  She has no axillary adenopathy.  Neurological: She is alert. She has normal  strength. No cranial nerve deficit or sensory deficit.  Skin: Skin is warm, dry and intact. No rash noted.  Psychiatric: Her speech is normal  and behavior is normal. Judgment normal. Her mood appears not anxious. Cognition and memory are normal. She does not exhibit a depressed mood.  Assessment & Plan:   AMW: The patient's preventative maintenance and recommended screening tests for an annual wellness exam were reviewed in full today.  Brought up to date unless services declined.  Counselled on the importance of diet, exercise, and its role in overall health and mortality.  The patient's FH and SH was reviewed, including their home life, tobacco status, and drug and alcohol status.   Vaccines: uptodate with Td, PNA and shingles vaccines, Flu 02/2011  DEXA: Osteopenia on fosamax stable 02/2010, she has been on this medication for many years. Will check DEXa this year.. Stop fosamax now. Mammo: nml 02/2012, plan to stop mammogram now at age 43. Colon: 07/2005 Dr. Russella Dar...adenomatous polyps, recommended repeat in 5 years.  Pap/DVE: not indicated  Nonsmoker

## 2012-12-01 NOTE — Patient Instructions (Addendum)
Work on healthy eating habits and regualr exercise.  Make an appt with Dr. Russella Dar for a repeat colonoscopy given past adenomatous polyps. Stop at front desk to set up bone density..  Go ahead and stop fosamax.

## 2012-12-01 NOTE — Assessment & Plan Note (Signed)
Due for DEXA. Stop fosamax as she has been on >5 years. Rechefck DEXa in 2 years again.

## 2012-12-07 DIAGNOSIS — M899 Disorder of bone, unspecified: Secondary | ICD-10-CM | POA: Diagnosis not present

## 2012-12-12 ENCOUNTER — Encounter: Payer: Self-pay | Admitting: Family Medicine

## 2012-12-12 ENCOUNTER — Encounter: Payer: Self-pay | Admitting: *Deleted

## 2013-02-06 DIAGNOSIS — Z23 Encounter for immunization: Secondary | ICD-10-CM | POA: Diagnosis not present

## 2013-02-26 ENCOUNTER — Telehealth: Payer: Self-pay | Admitting: Family Medicine

## 2013-02-26 NOTE — Telephone Encounter (Signed)
Patient Information:  Caller Name: Talene  Phone: [phone]  Patient: Meredith Kramer, Meredith Kramer  Gender: Female  DOB: 06/16/1929  Age: 79 Years  PCP: Kerby Nora (Family Practice)  Office Follow Up:  Does the office need to follow up with this patient?: Yes  Instructions For The Office: review/consider appt workin krs/can  RN Note:  Onset of right sided abdominal pain RLQ 02/22/13; initially was stronger, but although still significant, it has become less intense over past 24 hours.  Last BM 02/26/13 but had not had one for 2 days prior.  Afebrile.  Per abdominal pain protocol, advised appt today due to age > 38; per Epic, no appts available today and patient was given appt 02/27/13 1045 with Dr. Ermalene Searing.  Info to office for staff/provider review/consideration of appt workin/callback.  May reach patient at [phone].  krs/can  Symptoms  Reason For Call & Symptoms: abdominal pain right side  Reviewed Health History In EMR: Yes  Reviewed Medications In EMR: Yes  Reviewed Allergies In EMR: Yes  Reviewed Surgeries / Procedures: Yes  Date of Onset of Symptoms: 02/22/2013  Guideline(s) Used:  Abdominal Pain - Female  Disposition Per Guideline:   See Today in Office  Reason For Disposition Reached:   Age > 60 years  Advice Given:  N/A  Patient Will Follow Care Advice:  YES

## 2013-02-27 ENCOUNTER — Encounter: Payer: Self-pay | Admitting: Family Medicine

## 2013-02-27 ENCOUNTER — Encounter (INDEPENDENT_AMBULATORY_CARE_PROVIDER_SITE_OTHER): Payer: Self-pay

## 2013-02-27 ENCOUNTER — Ambulatory Visit (INDEPENDENT_AMBULATORY_CARE_PROVIDER_SITE_OTHER): Payer: Medicare Other | Admitting: Family Medicine

## 2013-02-27 VITALS — BP 130/60 | HR 77 | Temp 98.5°F | Ht 63.0 in | Wt 112.5 lb

## 2013-02-27 DIAGNOSIS — S335XXA Sprain of ligaments of lumbar spine, initial encounter: Secondary | ICD-10-CM

## 2013-02-27 DIAGNOSIS — S39012A Strain of muscle, fascia and tendon of lower back, initial encounter: Secondary | ICD-10-CM | POA: Insufficient documentation

## 2013-02-27 MED ORDER — MELOXICAM 7.5 MG PO TABS: 7.5000 mg | ORAL_TABLET | Freq: Every day | ORAL | Status: DC

## 2013-02-27 NOTE — Assessment & Plan Note (Signed)
Use heat, gentle stretching and can use meloxicam for pain and inflammation.  Follow up in 1-2 week if not feeling better. Call if new symtpoms.

## 2013-02-27 NOTE — Patient Instructions (Signed)
Use heat, gentle stretching and can use meloxicam for pain and inflammation.  Follow up in 1-2 week if not feeling better. Call if new symtpoms. 

## 2013-02-27 NOTE — Progress Notes (Signed)
79 year old female presents with new onset 5 days of  Right flank pain, intermittant when she moves. Increases , "grabs' with sneezing, some increase with deep breath. 7-8/10 when sharp.  No SOB, no chest pain, no dysuria, no abdominal pain, no hematuria.  No falls, no injury known. She had to lift some cans of pepsi but was already having pain at that time.  She has not used any med or heat for this.   Review of Systems - General ROS: negative, no diarrhea but occ chronic  Constipation.   No fever, no cramps.  no numbness, no tingling, no weakness,  Occ cramping in right leg earlier today.    GEN: nad, alert and oriented HEENT: mucous membranes moist NECK: supple w/o LA CV: rrr.  no murmur PULM: ctab, no inc wob ABD: soft, +bs EXT: no edema SKIN: no acute rash ( no sign of shingles vesicles, no hypersensitivity  MSK: ttp over right posterior  Back, increases with stretching to left, mild ttp diffusely at ribcage base.  NO vertebral ttp Strength in B lower ext 5/5.

## 2014-02-20 DIAGNOSIS — Z23 Encounter for immunization: Secondary | ICD-10-CM | POA: Diagnosis not present

## 2014-02-21 DIAGNOSIS — H2513 Age-related nuclear cataract, bilateral: Secondary | ICD-10-CM | POA: Diagnosis not present

## 2014-02-21 DIAGNOSIS — H5212 Myopia, left eye: Secondary | ICD-10-CM | POA: Diagnosis not present

## 2014-02-21 DIAGNOSIS — H43813 Vitreous degeneration, bilateral: Secondary | ICD-10-CM | POA: Diagnosis not present

## 2014-02-21 DIAGNOSIS — H43393 Other vitreous opacities, bilateral: Secondary | ICD-10-CM | POA: Diagnosis not present

## 2014-07-03 ENCOUNTER — Telehealth: Payer: Self-pay | Admitting: Family Medicine

## 2014-07-03 DIAGNOSIS — E785 Hyperlipidemia, unspecified: Secondary | ICD-10-CM

## 2014-07-03 DIAGNOSIS — M8580 Other specified disorders of bone density and structure, unspecified site: Secondary | ICD-10-CM

## 2014-07-03 DIAGNOSIS — R7303 Prediabetes: Secondary | ICD-10-CM

## 2014-07-03 NOTE — Telephone Encounter (Signed)
-----   Message from Ellamae Sia sent at 06/27/2014  4:20 PM EST ----- Regarding: Lab orders for Thursday,2.18.16 Patient is scheduled for CPX labs, please order future labs, Thanks , Karna Christmas

## 2014-07-04 ENCOUNTER — Other Ambulatory Visit (INDEPENDENT_AMBULATORY_CARE_PROVIDER_SITE_OTHER): Payer: Medicare Other

## 2014-07-04 DIAGNOSIS — R7309 Other abnormal glucose: Secondary | ICD-10-CM

## 2014-07-04 DIAGNOSIS — M8580 Other specified disorders of bone density and structure, unspecified site: Secondary | ICD-10-CM | POA: Diagnosis not present

## 2014-07-04 DIAGNOSIS — E785 Hyperlipidemia, unspecified: Secondary | ICD-10-CM

## 2014-07-04 DIAGNOSIS — R7303 Prediabetes: Secondary | ICD-10-CM

## 2014-07-04 LAB — LIPID PANEL
CHOL/HDL RATIO: 3
CHOLESTEROL: 195 mg/dL (ref 0–200)
HDL: 69.8 mg/dL (ref >39.00)
LDL CALC: 112 mg/dL — AB (ref 0–99)
NONHDL: 125.2
Triglycerides: 68 mg/dL (ref 0.0–149.0)
VLDL: 13.6 mg/dL (ref 0.0–40.0)

## 2014-07-04 LAB — COMPREHENSIVE METABOLIC PANEL
ALT: 14 U/L (ref 0–35)
AST: 19 U/L (ref 0–37)
Albumin: 4.3 g/dL (ref 3.5–5.2)
Alkaline Phosphatase: 50 U/L (ref 39–117)
BILIRUBIN TOTAL: 0.5 mg/dL (ref 0.2–1.2)
BUN: 14 mg/dL (ref 6–23)
CO2: 33 meq/L — AB (ref 19–32)
CREATININE: 0.71 mg/dL (ref 0.40–1.20)
Calcium: 9.5 mg/dL (ref 8.4–10.5)
Chloride: 95 mEq/L — ABNORMAL LOW (ref 96–112)
GFR: 83.71 mL/min (ref >60.00)
Glucose, Bld: 118 mg/dL — ABNORMAL HIGH (ref 70–99)
Potassium: 3.8 mEq/L (ref 3.5–5.1)
Sodium: 132 mEq/L — ABNORMAL LOW (ref 135–145)
Total Protein: 7 g/dL (ref 6.0–8.3)

## 2014-07-04 LAB — VITAMIN D 25 HYDROXY (VIT D DEFICIENCY, FRACTURES): VITD: 44.2 ng/mL (ref 30.00–100.00)

## 2014-07-04 LAB — HEMOGLOBIN A1C: HEMOGLOBIN A1C: 6.2 % (ref 4.6–6.5)

## 2014-07-10 ENCOUNTER — Encounter: Payer: Self-pay | Admitting: Family Medicine

## 2014-07-10 DIAGNOSIS — Z1231 Encounter for screening mammogram for malignant neoplasm of breast: Secondary | ICD-10-CM | POA: Diagnosis not present

## 2014-07-11 ENCOUNTER — Encounter: Payer: Self-pay | Admitting: Family Medicine

## 2014-07-11 ENCOUNTER — Telehealth: Payer: Self-pay | Admitting: *Deleted

## 2014-07-11 ENCOUNTER — Ambulatory Visit (INDEPENDENT_AMBULATORY_CARE_PROVIDER_SITE_OTHER): Payer: Medicare Other | Admitting: Family Medicine

## 2014-07-11 VITALS — BP 180/67 | HR 62 | Temp 98.3°F | Ht 62.0 in | Wt 108.5 lb

## 2014-07-11 DIAGNOSIS — M810 Age-related osteoporosis without current pathological fracture: Secondary | ICD-10-CM

## 2014-07-11 DIAGNOSIS — Z7189 Other specified counseling: Secondary | ICD-10-CM

## 2014-07-11 DIAGNOSIS — E785 Hyperlipidemia, unspecified: Secondary | ICD-10-CM

## 2014-07-11 DIAGNOSIS — R7303 Prediabetes: Secondary | ICD-10-CM

## 2014-07-11 DIAGNOSIS — Z23 Encounter for immunization: Secondary | ICD-10-CM

## 2014-07-11 DIAGNOSIS — Z0000 Encounter for general adult medical examination without abnormal findings: Secondary | ICD-10-CM | POA: Diagnosis not present

## 2014-07-11 NOTE — Patient Instructions (Addendum)
Do not restrict salt in diet. Stop at front desk to set up bone density.

## 2014-07-11 NOTE — Progress Notes (Signed)
HPI             I have personally reviewed the Medicare Annual Wellness questionnaire and have noted 1. The patient's medical and social history 2. Their use of alcohol, tobacco or illicit drugs 3. Their current medications and supplements 4. The patient's functional ability including ADL's, fall risks, home safety risks and hearing or visual             impairment. 5. Diet and physical activities 6. Evidence for depression or mood disorders The patients weight, height, BMI and visual acuity have been recorded in the chart I have made referrals, counseling and provided education to the patient based review of the above and I have provided the pt with a written personalized care plan for preventive services.   Prediabetes:  Stable control. Lab Results  Component Value Date   HGBA1C 6.2 07/04/2014    High cholesterol:  LDL at goal < 130 on no med. 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  Other complaints:   Mild hyponatremia has returned. She avoids salt in diet. Not drinking a lot of water. Vit D in nml range.  Review of Systems  Constitutional: Negative for fever, fatigue and unexpected weight change.  HENT: Negative for ear pain, congestion, sore throat, sneezing, trouble swallowing and sinus pressure.  Eyes: Negative for pain and itching.  Respiratory: Negative for cough, shortness of breath and wheezi.lasta1c ng.  Cardiovascular: Negative for chest pain, palpitations and leg swelling.  Gastrointestinal: Negative for nausea, abdominal pain, diarrhea, constipation and blood in stool.  Genitourinary: Negative for dysuria, hematuria, vaginal discharge, difficulty urinating and menstrual problem.  Skin:  Negative for rash.  Neurological: Negative for syncope, weakness, light-headedness, numbness and headaches.  No recent falls  Psychiatric/Behavioral: Negative for confusion and dysphoric mood. The patient is not nervous/anxious.  Clearing throat, mild cough... No heartburn, mild runny nose no allergy symptoms.  Not interesetd in treatment.  Objective:   Physical Exam  Constitutional: Vital signs are normal. She appears well-developed and well-nourished. She is cooperative. Non-toxic appearance. She does not appear ill. No distress.  Elderly thin appearing female in NAD.  HENT:  Head: Normocephalic.  Right Ear: Hearing, tympanic membrane, external ear and ear canal normal.  Left Ear: Hearing, tympanic membrane, external ear and ear canal normal.  Nose: Nose normal.  Eyes: Conjunctivae, EOM and lids are normal. Pupils are equal, round, and reactive to light. No foreign bodies found.  Neck: Trachea normal and normal range of motion. Neck supple. Carotid bruit is not present. No mass and no thyromegaly present.  Cardiovascular: Normal rate, regular rhythm, S1 normal, S2 normal, normal heart sounds and intact distal pulses. Exam reveals no gallop.  No murmur heard.  Pulmonary/Chest: Effort normal and breath sounds normal. No respiratory distress. She has no wheezes. She has no rhonchi. She has no rales.  Abdominal: Soft. Normal appearance and bowel sounds are normal. She exhibits no distension, no fluid wave, no abdominal bruit and no mass. There is no hepatosplenomegaly. There is no tenderness. There is no rebound, no guarding and no CVA tenderness. No hernia.  Genitourinary: No breast swelling, tenderness, discharge or bleeding. Pelvic exam was performed with patient supine  Lymphadenopathy:  She has no cervical adenopathy.  She has no axillary adenopathy.  Neurological: She is alert. She has normal strength. No cranial nerve deficit or sensory deficit.  Skin: Skin is warm,  dry and intact. No rash noted.  Psychiatric: Her speech is normal and behavior is normal. Judgment normal. Her mood appears not anxious. Cognition and memory are normal. She does not exhibit a depressed mood.  Assessment & Plan:   AMW: The patient's preventative maintenance and recommended screening tests for an annual wellness exam were reviewed in full today.  Brought up to date unless services declined.  Counselled on the importance of diet, exercise, and its role in overall health and mortality.  The patient's FH and SH was reviewed, including their home life, tobacco status, and drug and alcohol status.   Vaccines: uptodate with Td, PNA and shingles vaccines, Flu 02/2014, given prevnar today DEXA: 2014  Stable off fosamax. Due for repeat this year. Mammo: 06/2014  Will stop. Colon: 07/2005 Dr. Fuller Plan...adenomatous polyps, recommended repeat in 5 years, but she never went. Not indicated given, no blood in stool, no new constipation. Will send message to question to Dr. Fuller Plan.  Pap/DVE: not indicated  Nonsmoker

## 2014-07-11 NOTE — Progress Notes (Signed)
Pre visit review using our clinic review tool, if applicable. No additional management support is needed unless otherwise documented below in the visit note. 

## 2014-07-11 NOTE — Telephone Encounter (Signed)
-----   Message from Jinny Sanders, MD sent at 07/11/2014  2:26 PM EST ----- Please call to Let Ms Deloria know she does not need to have any further colonoscopies for colon cancer screening! ----- Message -----    From: Ladene Artist, MD    Sent: 07/11/2014  12:08 PM      To: Jinny Sanders, MD  Guidelines recommend stopping screening and surveillance colonoscopies about age 78 so I would not recommend a colonoscopy for her unless she has LGI symptoms.    ----- Message -----    From: Jinny Sanders, MD    Sent: 07/11/2014  12:01 PM      To: Ladene Artist, MD  Our shared pt Nolah Krenzer had a colonoscopy showing adenomatous polyps in 2007.Marland Kitchen She was due for recall in 5 years at age 14 , but never went. She is now 54. Would you recommend her repeating this or given her age would you stop colon cancer screening.  Thanks,  Amy

## 2014-07-11 NOTE — Telephone Encounter (Signed)
Ms. Azuara notified as instructed by telephone.

## 2014-07-15 NOTE — Assessment & Plan Note (Signed)
Well controlled on no med. 

## 2014-07-15 NOTE — Assessment & Plan Note (Signed)
Stable control. Encouraged exercise, weight loss, healthy eating habits.  

## 2014-07-17 ENCOUNTER — Encounter: Payer: Self-pay | Admitting: Family Medicine

## 2014-10-23 ENCOUNTER — Encounter: Payer: Self-pay | Admitting: Family Medicine

## 2014-12-30 DIAGNOSIS — M810 Age-related osteoporosis without current pathological fracture: Secondary | ICD-10-CM | POA: Diagnosis not present

## 2015-01-02 ENCOUNTER — Encounter: Payer: Self-pay | Admitting: Family Medicine

## 2015-02-06 DIAGNOSIS — Z23 Encounter for immunization: Secondary | ICD-10-CM | POA: Diagnosis not present

## 2015-03-04 ENCOUNTER — Encounter: Payer: Self-pay | Admitting: Cardiology

## 2015-04-15 DIAGNOSIS — H43393 Other vitreous opacities, bilateral: Secondary | ICD-10-CM | POA: Diagnosis not present

## 2015-04-15 DIAGNOSIS — H2513 Age-related nuclear cataract, bilateral: Secondary | ICD-10-CM | POA: Diagnosis not present

## 2015-04-15 DIAGNOSIS — H43813 Vitreous degeneration, bilateral: Secondary | ICD-10-CM | POA: Diagnosis not present

## 2015-04-15 DIAGNOSIS — H25013 Cortical age-related cataract, bilateral: Secondary | ICD-10-CM | POA: Diagnosis not present

## 2015-07-10 ENCOUNTER — Telehealth: Payer: Self-pay | Admitting: Family Medicine

## 2015-07-10 DIAGNOSIS — R7303 Prediabetes: Secondary | ICD-10-CM

## 2015-07-10 DIAGNOSIS — M8580 Other specified disorders of bone density and structure, unspecified site: Secondary | ICD-10-CM

## 2015-07-10 DIAGNOSIS — E785 Hyperlipidemia, unspecified: Secondary | ICD-10-CM

## 2015-07-10 NOTE — Telephone Encounter (Signed)
-----   Message from Marchia Bond sent at 07/04/2015  2:22 PM EST ----- Regarding: Cpx labs Fri 2/24, need orders. Thanks! :-) Please order  future cpx labs for pt's upcoming lab appt. Thanks Aniceto Boss

## 2015-07-11 ENCOUNTER — Other Ambulatory Visit (INDEPENDENT_AMBULATORY_CARE_PROVIDER_SITE_OTHER): Payer: Medicare Other

## 2015-07-11 DIAGNOSIS — R7303 Prediabetes: Secondary | ICD-10-CM

## 2015-07-11 DIAGNOSIS — M8580 Other specified disorders of bone density and structure, unspecified site: Secondary | ICD-10-CM

## 2015-07-11 DIAGNOSIS — E785 Hyperlipidemia, unspecified: Secondary | ICD-10-CM

## 2015-07-11 LAB — LIPID PANEL
Cholesterol: 190 mg/dL (ref 0–200)
HDL: 74 mg/dL (ref >39.00)
LDL Cholesterol: 105 mg/dL — ABNORMAL HIGH (ref 0–99)
NONHDL: 115.76
Total CHOL/HDL Ratio: 3
Triglycerides: 56 mg/dL (ref 0.0–149.0)
VLDL: 11.2 mg/dL (ref 0.0–40.0)

## 2015-07-11 LAB — COMPREHENSIVE METABOLIC PANEL
ALBUMIN: 4.4 g/dL (ref 3.5–5.2)
ALK PHOS: 40 U/L (ref 39–117)
ALT: 12 U/L (ref 0–35)
AST: 16 U/L (ref 0–37)
BUN: 14 mg/dL (ref 6–23)
CO2: 34 mEq/L — ABNORMAL HIGH (ref 19–32)
CREATININE: 0.73 mg/dL (ref 0.40–1.20)
Calcium: 9.4 mg/dL (ref 8.4–10.5)
Chloride: 98 mEq/L (ref 96–112)
GFR: 80.87 mL/min (ref >60.00)
Glucose, Bld: 120 mg/dL — ABNORMAL HIGH (ref 70–99)
Potassium: 4.2 mEq/L (ref 3.5–5.1)
SODIUM: 136 meq/L (ref 135–145)
TOTAL PROTEIN: 6.9 g/dL (ref 6.0–8.3)
Total Bilirubin: 0.6 mg/dL (ref 0.2–1.2)

## 2015-07-11 LAB — HEMOGLOBIN A1C: HEMOGLOBIN A1C: 6.1 % (ref 4.6–6.5)

## 2015-07-11 LAB — VITAMIN D 25 HYDROXY (VIT D DEFICIENCY, FRACTURES): VITD: 31.16 ng/mL (ref 30.00–100.00)

## 2015-07-14 DIAGNOSIS — Z1231 Encounter for screening mammogram for malignant neoplasm of breast: Secondary | ICD-10-CM | POA: Diagnosis not present

## 2015-07-16 ENCOUNTER — Encounter: Payer: Self-pay | Admitting: Family Medicine

## 2015-07-18 ENCOUNTER — Ambulatory Visit (INDEPENDENT_AMBULATORY_CARE_PROVIDER_SITE_OTHER): Payer: Medicare Other | Admitting: Family Medicine

## 2015-07-18 ENCOUNTER — Encounter: Payer: Self-pay | Admitting: Family Medicine

## 2015-07-18 VITALS — BP 120/60 | HR 69 | Temp 98.6°F | Ht 61.5 in | Wt 107.2 lb

## 2015-07-18 DIAGNOSIS — Z7189 Other specified counseling: Secondary | ICD-10-CM | POA: Diagnosis not present

## 2015-07-18 DIAGNOSIS — R7303 Prediabetes: Secondary | ICD-10-CM

## 2015-07-18 DIAGNOSIS — E785 Hyperlipidemia, unspecified: Secondary | ICD-10-CM | POA: Diagnosis not present

## 2015-07-18 DIAGNOSIS — Z0000 Encounter for general adult medical examination without abnormal findings: Secondary | ICD-10-CM

## 2015-07-18 NOTE — Assessment & Plan Note (Signed)
HCPOA: daughter Lowell Guitar, full code, has living will: no heroic measures ( reviewed 2017)

## 2015-07-18 NOTE — Addendum Note (Signed)
Addended byEliezer Lofts E on: 07/18/2015 12:17 PM   Modules accepted: SmartSet

## 2015-07-18 NOTE — Progress Notes (Signed)
Pre visit review using our clinic review tool, if applicable. No additional management support is needed unless otherwise documented below in the visit note. 

## 2015-07-18 NOTE — Patient Instructions (Signed)
Work on low carb diet, decrease bread, pasta, potatos, rice.  Increase fiber and protein. Increase water intake.

## 2015-07-18 NOTE — Assessment & Plan Note (Signed)
Recommend low carb diet and continued exercise.

## 2015-07-18 NOTE — Progress Notes (Signed)
I have personally reviewed the Medicare Annual Wellness questionnaire and have noted 1. The patient's medical and social history 2. Their use of alcohol, tobacco or illicit drugs 3. Their current medications and supplements 4. The patient's functional ability including ADL's, fall risks, home safety risks and hearing or visual             impairment. 5. Diet and physical activities 6. Evidence for depression or mood disorders 7.         Updated provider list Cognitive evaluation was performed and recorded on pt medicare questionnaire form. The patients weight, height, BMI and visual acuity have been recorded in the chart  I have made referrals, counseling and provided education to the patient based review of the above and I have provided the pt with a written personalized care plan for preventive services.   Prediabetes: Stable control. Lab Results  Component Value Date   HGBA1C 6.1 07/11/2015   High cholesterol: LDL at goal < 130 on no med. Lab Results  Component Value Date   CHOL 190 07/11/2015   HDL 74.00 07/11/2015   LDLCALC 105* 07/11/2015   TRIG 56.0 07/11/2015   CHOLHDL 3 07/11/2015  Diet compliance: Healthy diet, drinks normal amount of water  No ETOH  Exercise: 5 days a week aerobics class that daughter teaches at church or walking.  Other complaints:   Mild hyponatremia resolved.. Vit D in nml range.  Review of Systems  Constitutional: Negative for fever, fatigue and unexpected weight change.  HENT: Negative for ear pain, congestion, sore throat, sneezing, trouble swallowing and sinus pressure.  Eyes: Negative for pain and itching.  Respiratory: Negative for cough, shortness of breath and wheeze ng.  Cardiovascular: Negative for chest pain, palpitations and leg swelling.  Gastrointestinal: Negative for nausea, abdominal pain, diarrhea,  OCC constipation uses ex:lax small amount, occ pain in lower abd and blood in stool.  Genitourinary: Negative for  dysuria, hematuria, vaginal discharge, difficulty urinating and menstrual problem.  Skin: Negative for rash.  Neurological: Negative for syncope, weakness, light-headedness, numbness and headaches.  No recent falls  Psychiatric/Behavioral: Negative for confusion and dysphoric mood. The patient is not nervous/anxious.  Clearing throat, mild cough... No heartburn, mild runny nose no allergy symptoms.  Not interesetd in treatment.  Objective:   Physical Exam  Constitutional: Vital signs are normal. She appears well-developed and well-nourished. She is cooperative. Non-toxic appearance. She does not appear ill. No distress.  Elderly thin appearing female in NAD.  HENT:  Head: Normocephalic.  Right Ear: Hearing, tympanic membrane, external ear and ear canal normal.  Left Ear: Hearing, tympanic membrane, external ear and ear canal normal.  Nose: Nose normal.  Eyes: Conjunctivae, EOM and lids are normal. Pupils are equal, round, and reactive to light. No foreign bodies found.  Neck: Trachea normal and normal range of motion. Neck supple. Carotid bruit is not present. No mass and no thyromegaly present.  Cardiovascular: Normal rate, regular rhythm, S1 normal, S2 normal, normal heart sounds and intact distal pulses. Exam reveals no gallop.  No murmur heard.  Pulmonary/Chest: Effort normal and breath sounds normal. No respiratory distress. She has no wheezes. She has no rhonchi. She has no rales.  Abdominal: Soft. Normal appearance and bowel sounds are normal. She exhibits no distension, no fluid wave, no abdominal bruit and no mass. There is no hepatosplenomegaly. There is no tenderness. There is no rebound, no guarding and no CVA tenderness. No hernia.  Genitourinary: No breast swelling, tenderness, discharge or bleeding.  Pelvic exam was performed with patient supine  Lymphadenopathy:  She has no cervical adenopathy.  She has no axillary adenopathy.  Neurological: She is alert.  She has normal strength. No cranial nerve deficit or sensory deficit.  Skin: Skin is warm, dry and intact. No rash noted.  Psychiatric: Her speech is normal and behavior is normal. Judgment normal. Her mood appears not anxious. Cognition and memory are normal. She does not exhibit a depressed mood.  Assessment & Plan:   AMW: The patient's preventative maintenance and recommended screening tests for an annual wellness exam were reviewed in full today.  Brought up to date unless services declined.  Counselled on the importance of diet, exercise, and its role in overall health and mortality.  The patient's FH and SH was reviewed, including their home life, tobacco status, and drug and alcohol status.   Vaccines: uptodate  DEXA: 2016 improved off fosamax. Due for repeat in 2018. Mammo: 07/14/2015 Colon: 07/2005 Dr. Fuller Plan...adenomatous polyps,  Not indicated given, no blood in stool  Pap/DVE: not indicated  Nonsmoker quick memory test: 1/3, pt states stable short term memory changes, mild.

## 2015-07-18 NOTE — Assessment & Plan Note (Signed)
Excellent control on no med. 

## 2015-12-15 DIAGNOSIS — D485 Neoplasm of uncertain behavior of skin: Secondary | ICD-10-CM | POA: Diagnosis not present

## 2015-12-15 DIAGNOSIS — C4372 Malignant melanoma of left lower limb, including hip: Secondary | ICD-10-CM | POA: Diagnosis not present

## 2015-12-15 DIAGNOSIS — L821 Other seborrheic keratosis: Secondary | ICD-10-CM | POA: Diagnosis not present

## 2016-01-01 ENCOUNTER — Ambulatory Visit: Payer: Medicare Other | Admitting: Internal Medicine

## 2016-01-02 ENCOUNTER — Ambulatory Visit (INDEPENDENT_AMBULATORY_CARE_PROVIDER_SITE_OTHER): Payer: Medicare Other | Admitting: Family Medicine

## 2016-01-02 ENCOUNTER — Encounter: Payer: Self-pay | Admitting: Family Medicine

## 2016-01-02 VITALS — BP 110/60 | HR 68 | Temp 99.0°F | Wt 106.0 lb

## 2016-01-02 DIAGNOSIS — R350 Frequency of micturition: Secondary | ICD-10-CM

## 2016-01-02 MED ORDER — OXYBUTYNIN CHLORIDE 5 MG PO TABS: 5.0000 mg | ORAL_TABLET | Freq: Two times a day (BID) | ORAL | 1 refills | Status: DC | PRN

## 2016-01-02 NOTE — Progress Notes (Signed)
Pre visit review using our clinic review tool, if applicable. No additional management support is needed unless otherwise documented below in the visit note. 

## 2016-01-02 NOTE — Progress Notes (Signed)
Frequent urination.  Sx started a few months ago.  Having to get up at night x3.  In the AM will have to double void.  She is planning a road trip and wanted to know about options.  Leaving town in about 10 days, going to [place].  No burning with urination.  She doesn't leak urine with cough or laughing.  No blood seen in urine.  No FCNAVD.    Meds, vitals, and allergies reviewed.   ROS: Per HPI unless specifically indicated in ROS section   GEN: nad, alert and oriented HEENT: mucous membranes moist NECK: supple w/o LA CV: rrr.  PULM: ctab, no inc wob ABD: soft, +bs EXT: no edema SKIN: no acute rash

## 2016-01-02 NOTE — Patient Instructions (Signed)
We'll contact you with your lab report. Try oxybutynin in the meantime.  Start with 1 tab a day and if tolerated then increase up to twice a day.  Take care.  Glad to see you.

## 2016-01-04 DIAGNOSIS — N3281 Overactive bladder: Secondary | ICD-10-CM | POA: Insufficient documentation

## 2016-01-04 NOTE — Assessment & Plan Note (Signed)
Sent home with u/a container, she'll drop.  Likely OAB.  No stress incontinence.  Likely not infectious.   She can try oxybutynin in the meantime.  Start with 1 tab a day and if tolerated then increase up to twice a day.  She agrees.  Fu prn after u/a dropped off.

## 2016-01-05 LAB — POC URINALSYSI DIPSTICK (AUTOMATED)
BILIRUBIN UA: NEGATIVE
Blood, UA: NEGATIVE
Glucose, UA: NEGATIVE
KETONES UA: NEGATIVE
LEUKOCYTES UA: NEGATIVE
Nitrite, UA: NEGATIVE
PH UA: 6.5
PROTEIN UA: NEGATIVE
SPEC GRAV UA: 1.02
Urobilinogen, UA: 4

## 2016-01-05 NOTE — Addendum Note (Signed)
Addended by: Josetta Huddle on: 01/05/2016 11:45 AM   Modules accepted: Orders

## 2016-02-10 DIAGNOSIS — L578 Other skin changes due to chronic exposure to nonionizing radiation: Secondary | ICD-10-CM | POA: Diagnosis not present

## 2016-02-10 DIAGNOSIS — C439 Malignant melanoma of skin, unspecified: Secondary | ICD-10-CM | POA: Insufficient documentation

## 2016-02-10 DIAGNOSIS — C4372 Malignant melanoma of left lower limb, including hip: Secondary | ICD-10-CM | POA: Diagnosis not present

## 2016-02-10 DIAGNOSIS — C44729 Squamous cell carcinoma of skin of left lower limb, including hip: Secondary | ICD-10-CM | POA: Diagnosis not present

## 2016-02-10 DIAGNOSIS — Z85820 Personal history of malignant melanoma of skin: Secondary | ICD-10-CM | POA: Insufficient documentation

## 2016-02-10 DIAGNOSIS — L814 Other melanin hyperpigmentation: Secondary | ICD-10-CM | POA: Diagnosis not present

## 2016-03-10 DIAGNOSIS — Z23 Encounter for immunization: Secondary | ICD-10-CM | POA: Diagnosis not present

## 2016-04-16 DIAGNOSIS — H524 Presbyopia: Secondary | ICD-10-CM | POA: Diagnosis not present

## 2016-04-16 DIAGNOSIS — H5201 Hypermetropia, right eye: Secondary | ICD-10-CM | POA: Diagnosis not present

## 2016-04-16 DIAGNOSIS — H52223 Regular astigmatism, bilateral: Secondary | ICD-10-CM | POA: Diagnosis not present

## 2016-04-16 DIAGNOSIS — H2513 Age-related nuclear cataract, bilateral: Secondary | ICD-10-CM | POA: Diagnosis not present

## 2016-07-05 DIAGNOSIS — D2272 Melanocytic nevi of left lower limb, including hip: Secondary | ICD-10-CM | POA: Diagnosis not present

## 2016-07-05 DIAGNOSIS — Z85828 Personal history of other malignant neoplasm of skin: Secondary | ICD-10-CM | POA: Diagnosis not present

## 2016-07-05 DIAGNOSIS — D2261 Melanocytic nevi of right upper limb, including shoulder: Secondary | ICD-10-CM | POA: Diagnosis not present

## 2016-07-05 DIAGNOSIS — D225 Melanocytic nevi of trunk: Secondary | ICD-10-CM | POA: Diagnosis not present

## 2016-08-06 ENCOUNTER — Encounter: Payer: Self-pay | Admitting: Family Medicine

## 2016-08-06 ENCOUNTER — Ambulatory Visit (INDEPENDENT_AMBULATORY_CARE_PROVIDER_SITE_OTHER): Payer: PPO | Admitting: Family Medicine

## 2016-08-06 DIAGNOSIS — R0981 Nasal congestion: Secondary | ICD-10-CM | POA: Insufficient documentation

## 2016-08-06 NOTE — Assessment & Plan Note (Signed)
Possibly viral URI or allergies. Now resolved. Can try saline or nasal steroid if symptoms recur.

## 2016-08-06 NOTE — Progress Notes (Signed)
   Subjective:    Patient ID: Meredith Kramer, female    DOB: 1930/02/13, 83 y.o.   MRN: [id number]  URI   This is a new problem. The current episode started 1 to 4 weeks ago (10-12 days). The problem has been gradually improving. There has been no fever. Associated symptoms include congestion and coughing. Pertinent negatives include no ear pain, headaches, plugged ear sensation, sinus pain, sore throat, swollen glands or wheezing. Associated symptoms comments: Post nasal drip  no sneeze, no itchy eyes.. Treatments tried: buffrin. The treatment provided significant relief.      Review of Systems  HENT: Positive for congestion. Negative for ear pain, sinus pain and sore throat.   Respiratory: Positive for cough. Negative for wheezing.   Neurological: Negative for headaches.       Objective:   Physical Exam  Constitutional: She appears well-developed.  HENT:  Head: Normocephalic.  Right Ear: External ear normal.  Left Ear: External ear normal.  Nose: Nose normal.  Mouth/Throat: Oropharynx is clear and moist. No oropharyngeal exudate.  Eyes: Conjunctivae are normal. Pupils are equal, round, and reactive to light.  Cardiovascular: Normal rate and regular rhythm.   No murmur heard. Pulmonary/Chest: Effort normal and breath sounds normal.  Lymphadenopathy:    She has no cervical adenopathy.          Assessment & Plan:

## 2016-08-06 NOTE — Progress Notes (Signed)
Pre visit review using our clinic review tool, if applicable. No additional management support is needed unless otherwise documented below in the visit note. 

## 2016-08-06 NOTE — Patient Instructions (Signed)
If symptoms recur.. Can try a course of over the counter nasal steroid like flonase or nasal saline spray

## 2016-09-09 ENCOUNTER — Telehealth: Payer: Self-pay | Admitting: Family Medicine

## 2016-09-09 DIAGNOSIS — R7303 Prediabetes: Secondary | ICD-10-CM

## 2016-09-09 DIAGNOSIS — E782 Mixed hyperlipidemia: Secondary | ICD-10-CM

## 2016-09-09 NOTE — Telephone Encounter (Signed)
-----   Message from Eustace Pen, LPN sent at 0/07/4959  4:10 PM EDT ----- Regarding: Labs 4/27 Please place lab orders.   [employer]  Thank you.

## 2016-09-10 ENCOUNTER — Ambulatory Visit (INDEPENDENT_AMBULATORY_CARE_PROVIDER_SITE_OTHER): Payer: PPO

## 2016-09-10 VITALS — BP 124/62 | HR 67 | Temp 97.9°F | Wt 102.8 lb

## 2016-09-10 DIAGNOSIS — R7303 Prediabetes: Secondary | ICD-10-CM | POA: Diagnosis not present

## 2016-09-10 DIAGNOSIS — Z0000 Encounter for general adult medical examination without abnormal findings: Secondary | ICD-10-CM | POA: Diagnosis not present

## 2016-09-10 DIAGNOSIS — E782 Mixed hyperlipidemia: Secondary | ICD-10-CM

## 2016-09-10 LAB — COMPREHENSIVE METABOLIC PANEL
ALBUMIN: 4.4 g/dL (ref 3.5–5.2)
ALT: 12 U/L (ref 0–35)
AST: 17 U/L (ref 0–37)
Alkaline Phosphatase: 41 U/L (ref 39–117)
BILIRUBIN TOTAL: 0.5 mg/dL (ref 0.2–1.2)
BUN: 16 mg/dL (ref 6–23)
CALCIUM: 9.6 mg/dL (ref 8.4–10.5)
CHLORIDE: 98 meq/L (ref 96–112)
CO2: 33 meq/L — AB (ref 19–32)
CREATININE: 0.65 mg/dL (ref 0.40–1.20)
GFR: 92.2 mL/min (ref >60.00)
Glucose, Bld: 100 mg/dL — ABNORMAL HIGH (ref 70–99)
Potassium: 4.3 mEq/L (ref 3.5–5.1)
Sodium: 135 mEq/L (ref 135–145)
Total Protein: 6.7 g/dL (ref 6.0–8.3)

## 2016-09-10 LAB — HEMOGLOBIN A1C: Hgb A1c MFr Bld: 6.2 % (ref 4.6–6.5)

## 2016-09-10 LAB — LIPID PANEL
CHOL/HDL RATIO: 3
Cholesterol: 195 mg/dL (ref 0–200)
HDL: 77.3 mg/dL (ref >39.00)
LDL Cholesterol: 108 mg/dL — ABNORMAL HIGH (ref 0–99)
NONHDL: 117.9
TRIGLYCERIDES: 49 mg/dL (ref 0.0–149.0)
VLDL: 9.8 mg/dL (ref 0.0–40.0)

## 2016-09-10 NOTE — Patient Instructions (Signed)
Meredith Kramer , Thank you for taking time to come for your Medicare Wellness Visit. I appreciate your ongoing commitment to your health goals. Please review the following plan we discussed and let me know if I can assist you in the future.   These are the goals we discussed: Goals    . Increase water intake          Starting 09/10/2016, I will attempt to drink at least 6-8 glasses of water daily.        This is a list of the screening recommended for you and due dates:  Health Maintenance  Topic Date Due  . Mammogram  07/12/2017*  . Flu Shot  12/15/2016  . Tetanus Vaccine  04/27/2017  . DEXA scan (bone density measurement)  Completed  . Pneumonia vaccines  Completed  *Topic was postponed. The date shown is not the original due date.   Preventive Care for Adults  A healthy lifestyle and preventive care can promote health and wellness. Preventive health guidelines for adults include the following key practices.  . A routine yearly physical is a good way to check with your health care provider about your health and preventive screening. It is a chance to share any concerns and updates on your health and to receive a thorough exam.  . Visit your dentist for a routine exam and preventive care every 6 months. Brush your teeth twice a day and floss once a day. Good oral hygiene prevents tooth decay and gum disease.  . The frequency of eye exams is based on your age, health, family medical history, use  of contact lenses, and other factors. Follow your health care provider's ecommendations for frequency of eye exams.  . Eat a healthy diet. Foods like vegetables, fruits, whole grains, low-fat dairy products, and lean protein foods contain the nutrients you need without too many calories. Decrease your intake of foods high in solid fats, added sugars, and salt. Eat the right amount of calories for you. Get information about a proper diet from your health care provider, if necessary.  . Regular  physical exercise is one of the most important things you can do for your health. Most adults should get at least 150 minutes of moderate-intensity exercise (any activity that increases your heart rate and causes you to sweat) each week. In addition, most adults need muscle-strengthening exercises on 2 or more days a week.  Silver Sneakers may be a benefit available to you. To determine eligibility, you may visit the website: www.silversneakers.com or contact program at (623)053-3418 Mon-Fri between 8AM-8PM.   . Maintain a healthy weight. The body mass index (BMI) is a screening tool to identify possible weight problems. It provides an estimate of body fat based on height and weight. Your health care provider can find your BMI and can help you achieve or maintain a healthy weight.   For adults 20 years and older: ? A BMI below 18.5 is considered underweight. ? A BMI of 18.5 to 24.9 is normal. ? A BMI of 25 to 29.9 is considered overweight. ? A BMI of 30 and above is considered obese.   . Maintain normal blood lipids and cholesterol levels by exercising and minimizing your intake of saturated fat. Eat a balanced diet with plenty of fruit and vegetables. Blood tests for lipids and cholesterol should begin at age 61 and be repeated every 5 years. If your lipid or cholesterol levels are high, you are over 50, or you are at high  risk for heart disease, you may need your cholesterol levels checked more frequently. Ongoing high lipid and cholesterol levels should be treated with medicines if diet and exercise are not working.  . If you smoke, find out from your health care provider how to quit. If you do not use tobacco, please do not start.  . If you choose to drink alcohol, please do not consume more than 2 drinks per day. One drink is considered to be 12 ounces (355 mL) of beer, 5 ounces (148 mL) of wine, or 1.5 ounces (44 mL) of liquor.  . If you are 11-35 years old, ask your health care provider if  you should take aspirin to prevent strokes.  . Use sunscreen. Apply sunscreen liberally and repeatedly throughout the day. You should seek shade when your shadow is shorter than you. Protect yourself by wearing long sleeves, pants, a wide-brimmed hat, and sunglasses year round, whenever you are outdoors.  . Once a month, do a whole body skin exam, using a mirror to look at the skin on your back. Tell your health care provider of new moles, moles that have irregular borders, moles that are larger than a pencil eraser, or moles that have changed in shape or color.

## 2016-09-10 NOTE — Progress Notes (Signed)
Pre visit review using our clinic review tool, if applicable. No additional management support is needed unless otherwise documented below in the visit note. 

## 2016-09-10 NOTE — Progress Notes (Signed)
I reviewed health advisor's note, was available for consultation, and agree with documentation and plan.  

## 2016-09-10 NOTE — Progress Notes (Signed)
PCP notes:   Health maintenance:  Mammogram - addressed; pt plans to schedule future appt   Abnormal screenings:   Hearing - failed  Patient concerns:   None  Nurse concerns:  None  Next PCP appt:   09/21/2016 @ 1400

## 2016-09-10 NOTE — Progress Notes (Signed)
Subjective:   Meredith Kramer is a 83 y.o. female who presents for Medicare Annual (Subsequent) preventive examination.  Review of Systems:  N/A Cardiac Risk Factors include: advanced age (>60men, >64 women);dyslipidemia     Objective:     Vitals: BP 124/62 (BP Location: Right Arm, Patient Position: Sitting, Cuff Size: Normal)   Pulse 67   Temp 97.9 F (36.6 C) (Oral)   Wt 102 lb 12 oz (46.6 kg)   SpO2 99%   BMI 19.10 kg/m   Body mass index is 19.1 kg/m.   Tobacco History  Smoking Status  . Never Smoker  Smokeless Tobacco  . Never Used     Counseling given: No   Past Medical History:  Diagnosis Date  . Hyperlipidemia   . Osteopenia    Past Surgical History:  Procedure Laterality Date  . TONSILLECTOMY     Family History  Problem Relation Age of Onset  . Stroke Mother     age 9  . Emphysema Father     [employer]  . Coronary artery disease Father     MI age 25  . Coronary artery disease Brother     stent  . Cancer Neg Hx    History  Sexual Activity  . Sexual activity: Not on file    Outpatient Encounter Prescriptions as of 09/10/2016  Medication Sig  . acetaminophen (TYLENOL) 500 MG tablet Take by mouth as needed   . Calcium Carbonate-Vitamin D (CALTRATE 600+D) 600-400 MG-UNIT per tablet Take 1 tablet by mouth daily.   . diphenhydrAMINE (SOMINEX) 25 MG tablet Take 25 mg by mouth at bedtime as needed.    Marland Kitchen oxybutynin (DITROPAN) 5 MG tablet Take 1 tablet (5 mg total) by mouth 2 (two) times daily as needed for bladder spasms. (Patient not taking: Reported on 08/06/2016)   No facility-administered encounter medications on file as of 09/10/2016.     Activities of Daily Living In your present state of health, do you have any difficulty performing the following activities: 09/10/2016  Hearing? Y  Vision? Y  Difficulty concentrating or making decisions? Y  Walking or climbing stairs? N  Dressing or bathing? N  Doing errands, shopping? N  Preparing  Food and eating ? N  Using the Toilet? N  In the past six months, have you accidently leaked urine? Y  Do you have problems with loss of bowel control? N  Managing your Medications? N  Managing your Finances? N  Housekeeping or managing your Housekeeping? N  Some recent data might be hidden    Patient Care Team: Jinny Sanders, MD as PCP - General    Assessment:     Hearing Screening   125Hz  250Hz  500Hz  1000Hz  2000Hz  3000Hz  4000Hz  6000Hz  8000Hz   Right ear:   0 0 0  0    Left ear:   0 0 0  0    Vision Screening Comments: Last vision exam in 2017 with Dr. Zadie Rhine   Exercise Activities and Dietary recommendations Current Exercise Habits: The patient does not participate in regular exercise at present, Exercise limited by: None identified  Goals    . Increase water intake          Starting 09/10/2016, I will attempt to drink at least 6-8 glasses of water daily.       Fall Risk Fall Risk  09/10/2016 07/18/2015 07/11/2014 12/01/2012  Falls in the past year? No No No No   Depression Screen PHQ 2/9 Scores 09/10/2016 07/18/2015  07/11/2014 12/01/2012  PHQ - 2 Score 0 0 0 0     Cognitive Function MMSE - Mini Mental State Exam 09/10/2016  Orientation to time 5  Orientation to Place 5  Registration 3  Attention/ Calculation 0  Recall 3  Language- name 2 objects 0  Language- repeat 1  Language- follow 3 step command 3  Language- read & follow direction 0  Write a sentence 0  Copy design 0  Total score 20     PLEASE NOTE: A Mini-Cog screen was completed. Maximum score is 20. A value of 0 denotes this part of Folstein MMSE was not completed or the patient failed this part of the Mini-Cog screening.   Mini-Cog Screening Orientation to Time - Max 5 pts Orientation to Place - Max 5 pts Registration - Max 3 pts Recall - Max 3 pts Language Repeat - Max 1 pts Language Follow 3 Step Command - Max 3 pts c    Immunization History  Administered Date(s) Administered  . Influenza Split  03/01/2012  . Influenza Whole 04/28/2007, 02/20/2008, 01/28/2010  . Influenza, High Dose Seasonal PF 02/20/2014, 02/15/2015, 03/10/2016  . Pneumococcal Conjugate-13 07/11/2014  . Pneumococcal Polysaccharide-23 04/24/2010  . Td 04/28/2007  . Zoster 04/24/2010   Screening Tests Health Maintenance  Topic Date Due  . MAMMOGRAM  07/12/2017 (Originally 07/13/2016)  . INFLUENZA VACCINE  12/15/2016  . TETANUS/TDAP  04/27/2017  . DEXA SCAN  Completed  . PNA vac Low Risk Adult  Completed      Plan:     I have personally reviewed and addressed the Medicare Annual Wellness questionnaire and have noted the following in the patient's chart:  A. Medical and social history B. Use of alcohol, tobacco or illicit drugs  C. Current medications and supplements D. Functional ability and status E.  Nutritional status F.  Physical activity G. Advance directives H. List of other physicians I.  Hospitalizations, surgeries, and ER visits in previous 12 months J.  Tyler to include hearing, vision, cognitive, depression L. Referrals and appointments - none  In addition, I have reviewed and discussed with patient certain preventive protocols, quality metrics, and best practice recommendations. A written personalized care plan for preventive services as well as general preventive health recommendations were provided to patient.  See attached scanned questionnaire for additional information.   Signed,   Lindell Noe, MHA, BS, LPN Health Coach

## 2016-09-16 ENCOUNTER — Ambulatory Visit: Payer: PPO

## 2016-09-21 ENCOUNTER — Ambulatory Visit (INDEPENDENT_AMBULATORY_CARE_PROVIDER_SITE_OTHER): Payer: PPO | Admitting: Family Medicine

## 2016-09-21 ENCOUNTER — Encounter: Payer: Self-pay | Admitting: Family Medicine

## 2016-09-21 VITALS — BP 120/60 | HR 66 | Temp 98.7°F | Ht 61.5 in | Wt 102.5 lb

## 2016-09-21 DIAGNOSIS — S39012A Strain of muscle, fascia and tendon of lower back, initial encounter: Secondary | ICD-10-CM

## 2016-09-21 DIAGNOSIS — E782 Mixed hyperlipidemia: Secondary | ICD-10-CM

## 2016-09-21 DIAGNOSIS — M8580 Other specified disorders of bone density and structure, unspecified site: Secondary | ICD-10-CM

## 2016-09-21 DIAGNOSIS — R0981 Nasal congestion: Secondary | ICD-10-CM | POA: Diagnosis not present

## 2016-09-21 DIAGNOSIS — Z0001 Encounter for general adult medical examination with abnormal findings: Secondary | ICD-10-CM | POA: Diagnosis not present

## 2016-09-21 DIAGNOSIS — Z85820 Personal history of malignant melanoma of skin: Secondary | ICD-10-CM | POA: Diagnosis not present

## 2016-09-21 DIAGNOSIS — R7303 Prediabetes: Secondary | ICD-10-CM

## 2016-09-21 DIAGNOSIS — Z0000 Encounter for general adult medical examination without abnormal findings: Secondary | ICD-10-CM

## 2016-09-21 NOTE — Progress Notes (Signed)
Pre visit review using our clinic review tool, if applicable. No additional management support is needed unless otherwise documented below in the visit note. 

## 2016-09-21 NOTE — Assessment & Plan Note (Signed)
Followed by derm. 

## 2016-09-21 NOTE — Assessment & Plan Note (Addendum)
Likely allergic rhinitis. Bilateral ETD as well causing some of the decreased hearing. Treat with nasal steroids.

## 2016-09-21 NOTE — Patient Instructions (Addendum)
Make sure to keep up with high protein foods, we don't want you to loose weight.  Stop by the front desk to set up bone density. Can use tylenol for pain in low back.   Start flonase over the counter twice daily for congestion and fluid in ears  2-3 weeks.

## 2016-09-21 NOTE — Progress Notes (Signed)
Subjective:    Patient ID: Meredith Kramer, female    DOB: [birth year], 83 y.o.   MRN: [id number]  HPI  The patient presents for annual medicare wellness, complete physical and review of chronic health problems. He/She also has the following acute concerns today.: 1.continued nasal congestion, off and on.  2. Off and on low back pain, no radiation of pain. Chronic over 1 year, no recent fall.  She has not tried anything for pain.  occ using a brace for support.  The patient saw Candis Musa, LPN for medicare wellness. Note reviewed in detail and important notes copied below. Health maintenance: Mammogram - addressed; pt plans to schedule future appt  Abnormal screenings:  Hearing - failed  09/21/16 Today: hearing slightly decreased per pt.  Not ready for hearing  Aide. May have wax in ear.. Will eval today.  Prediabetes:  Improved with lower carb diet. Lab Results  Component Value Date   HGBA1C 6.2 09/10/2016    Hyperlipidemia:  Good control LDl and HDL high on no medication. Lab Results  Component Value Date   CHOL 195 09/10/2016   HDL 77.30 09/10/2016   LDLCALC 108 (H) 09/10/2016   TRIG 49.0 09/10/2016   CHOLHDL 3 09/10/2016    Melanoma history:  Followed by  Payton Mccallum. Last seen few weeks ago. Healing biopsy sites.  Urinary urge incontinence:  Oxybutynin .Marland Kitchen minimaly helpful.   She has been trying to eat healthier diet. She has good appetite. Wt Readings from Last 3 Encounters:  09/21/16 102 lb 8 oz (46.5 kg)  09/10/16 102 lb 12 oz (46.6 kg)  08/06/16 104 lb 8 oz (47.4 kg)  Body mass index is 19.05 kg/m.   Social History /Family History/Past Medical History reviewed in detail and updated in EMR if needed. Blood pressure 120/60, pulse 66, temperature 98.7 F (37.1 C), temperature source Oral, height 5' 1.5" (1.562 m), weight 102 lb 8 oz (46.5 kg).  Review of Systems  Constitutional: Negative for fatigue and fever.  HENT: Negative for congestion.   Eyes:  Negative for pain.  Respiratory: Negative for cough and shortness of breath.   Cardiovascular: Negative for chest pain, palpitations and leg swelling.  Gastrointestinal: Negative for abdominal pain.  Genitourinary: Positive for frequency and urgency. Negative for dysuria and vaginal bleeding.  Musculoskeletal: Negative for back pain.  Neurological: Negative for syncope, light-headedness and headaches.  Psychiatric/Behavioral: Negative for dysphoric mood.       Objective:   Physical Exam  Constitutional: Vital signs are normal. She appears well-developed and well-nourished. She is cooperative.  Non-toxic appearance. She does not appear ill. No distress.  Elderly in NAD  HENT:  Head: Normocephalic.  Right Ear: Hearing, external ear and ear canal normal. A middle ear effusion is present.  Left Ear: Hearing, external ear and ear canal normal. A middle ear effusion is present.  Nose: Nose normal.  Eyes: Conjunctivae, EOM and lids are normal. Pupils are equal, round, and reactive to light. Lids are everted and swept, no foreign bodies found.  Neck: Trachea normal and normal range of motion. Neck supple. Carotid bruit is not present. No thyroid mass and no thyromegaly present.  Cardiovascular: Normal rate, regular rhythm, S1 normal, S2 normal, normal heart sounds and intact distal pulses.  Exam reveals no gallop.   No murmur heard. Pulmonary/Chest: Effort normal and breath sounds normal. No respiratory distress. She has no wheezes. She has no rhonchi. She has no rales.  Abdominal: Soft. Normal appearance and bowel sounds  are normal. She exhibits no distension, no fluid wave, no abdominal bruit and no mass. There is no hepatosplenomegaly. There is no tenderness. There is no rebound, no guarding and no CVA tenderness. No hernia.  Musculoskeletal:  No current ttp in back  Mild kyphosis, but able to sit up straight  Lymphadenopathy:    She has no cervical adenopathy.    She has no axillary  adenopathy.  Neurological: She is alert. She has normal strength. No cranial nerve deficit or sensory deficit. She exhibits normal muscle tone. Coordination and gait normal.  Skin: Skin is warm, dry and intact. No rash noted.  Psychiatric: Her speech is normal and behavior is normal. Judgment normal. Her mood appears not anxious. Cognition and memory are normal. She does not exhibit a depressed mood.          Assessment & Plan:  The patient's preventative maintenance and recommended screening tests for an annual wellness exam were reviewed in full today. Brought up to date unless services declined.  Counselled on the importance of diet, exercise, and its role in overall health and mortality. The patient's FH and SH was reviewed, including their home life, tobacco status, and drug and alcohol status.   Vaccines: uptodate  DEXA: 2016 improved off fosamax. Due for repeat in 2018. Mammo: 07/14/2015, plan to continue mammogram, but every other year. Colon: 07/2005 Dr. Fuller Plan...adenomatous polyps,  Not indicated  Pap/DVE: not indicated  Nonsmoker

## 2016-09-21 NOTE — Assessment & Plan Note (Signed)
Due for re-eval. 

## 2016-09-21 NOTE — Assessment & Plan Note (Signed)
No clear injury.Marland Kitchen likely OA change. No clear ttp consistently with compression fracture.  Stat home PT as tolerated.  Tylenol for pain.

## 2016-12-16 DIAGNOSIS — H2512 Age-related nuclear cataract, left eye: Secondary | ICD-10-CM | POA: Diagnosis not present

## 2016-12-16 DIAGNOSIS — H25811 Combined forms of age-related cataract, right eye: Secondary | ICD-10-CM | POA: Diagnosis not present

## 2016-12-16 DIAGNOSIS — H527 Unspecified disorder of refraction: Secondary | ICD-10-CM | POA: Diagnosis not present

## 2016-12-16 DIAGNOSIS — H02831 Dermatochalasis of right upper eyelid: Secondary | ICD-10-CM | POA: Diagnosis not present

## 2016-12-30 DIAGNOSIS — M810 Age-related osteoporosis without current pathological fracture: Secondary | ICD-10-CM | POA: Diagnosis not present

## 2016-12-30 DIAGNOSIS — M8588 Other specified disorders of bone density and structure, other site: Secondary | ICD-10-CM | POA: Diagnosis not present

## 2017-01-06 ENCOUNTER — Encounter: Payer: Self-pay | Admitting: Family Medicine

## 2017-01-25 ENCOUNTER — Ambulatory Visit: Payer: PPO | Admitting: Family Medicine

## 2017-02-01 ENCOUNTER — Ambulatory Visit (INDEPENDENT_AMBULATORY_CARE_PROVIDER_SITE_OTHER): Payer: PPO | Admitting: Family Medicine

## 2017-02-01 ENCOUNTER — Encounter: Payer: Self-pay | Admitting: Family Medicine

## 2017-02-01 VITALS — BP 130/60 | HR 61 | Temp 98.5°F | Ht 61.5 in | Wt 101.2 lb

## 2017-02-01 DIAGNOSIS — M810 Age-related osteoporosis without current pathological fracture: Secondary | ICD-10-CM

## 2017-02-01 DIAGNOSIS — Z23 Encounter for immunization: Secondary | ICD-10-CM

## 2017-02-01 MED ORDER — ALENDRONATE SODIUM 70 MG PO TABS: 70.0000 mg | ORAL_TABLET | ORAL | 11 refills | Status: DC

## 2017-02-01 NOTE — Assessment & Plan Note (Signed)
Worsening bone density compared to 2014 now off fosamax  4 years. Restart and repeat DEXA in 2 years.

## 2017-02-01 NOTE — Patient Instructions (Addendum)
Restart weekly fosamax as directed. Make sure to on empty stomach and wait 30-60 minutes before eating.  Work on [employer] bearing exercise.  Keep up calcium in diet and vit D supplement.

## 2017-02-01 NOTE — Progress Notes (Signed)
   Subjective:    Patient ID: Meredith Kramer, female    DOB: May 27, 1929, 83 y.o.   MRN: [id number]  HPI    83 year old female presents for discussion of treatment of newly diagnosed osteoporosis.  12/30/2016 DEXA: showed osteoporosis in multiple sites.  Lowest site in forearm at -4, but most significant is in femur  at -2.6. No better from 2016. Worse from 2014. Total spine did improve some from -2.7 to -1.9 from 2016 to 2018  Was on fosamax from 2009-2014.Marland Kitchen Stopped in 2014.  In 2014 forearm: T -2.9 and femur  T-1.6   GFR: 92 Review of Systems  Constitutional: Negative for fatigue and fever.  HENT: Negative for ear pain.   Eyes: Negative for pain.  Respiratory: Negative for chest tightness and shortness of breath.   Cardiovascular: Negative for chest pain, palpitations and leg swelling.  Gastrointestinal: Negative for abdominal pain.  Genitourinary: Negative for dysuria.       Objective:   Physical Exam  Constitutional: Vital signs are normal. She appears well-developed and well-nourished. She is cooperative.  Non-toxic appearance. She does not appear ill. No distress.  HENT:  Head: Normocephalic.  Right Ear: Hearing, tympanic membrane, external ear and ear canal normal. Tympanic membrane is not erythematous, not retracted and not bulging.  Left Ear: Hearing, tympanic membrane, external ear and ear canal normal. Tympanic membrane is not erythematous, not retracted and not bulging.  Nose: No mucosal edema or rhinorrhea. Right sinus exhibits no maxillary sinus tenderness and no frontal sinus tenderness. Left sinus exhibits no maxillary sinus tenderness and no frontal sinus tenderness.  Mouth/Throat: Uvula is midline, oropharynx is clear and moist and mucous membranes are normal.  Eyes: Pupils are equal, round, and reactive to light. Conjunctivae, EOM and lids are normal. Lids are everted and swept, no foreign bodies found.  Neck: Trachea normal and normal range of motion. Neck  supple. Carotid bruit is not present. No thyroid mass and no thyromegaly present.  Cardiovascular: Normal rate, regular rhythm, S1 normal, S2 normal, normal heart sounds, intact distal pulses and normal pulses.  Exam reveals no gallop and no friction rub.   No murmur heard. Pulmonary/Chest: Effort normal and breath sounds normal. No tachypnea. No respiratory distress. She has no decreased breath sounds. She has no wheezes. She has no rhonchi. She has no rales.  Abdominal: Soft. Normal appearance and bowel sounds are normal. There is no tenderness.  Neurological: She is alert.  Skin: Skin is warm, dry and intact. No rash noted.  Psychiatric: Her speech is normal and behavior is normal. Judgment and thought content normal. Her mood appears not anxious. Cognition and memory are normal. She does not exhibit a depressed mood.          Assessment & Plan:

## 2017-09-09 ENCOUNTER — Telehealth: Payer: Self-pay | Admitting: Family Medicine

## 2017-09-09 DIAGNOSIS — M810 Age-related osteoporosis without current pathological fracture: Secondary | ICD-10-CM

## 2017-09-09 DIAGNOSIS — R7303 Prediabetes: Secondary | ICD-10-CM

## 2017-09-09 DIAGNOSIS — E782 Mixed hyperlipidemia: Secondary | ICD-10-CM

## 2017-09-09 NOTE — Telephone Encounter (Signed)
-----   Message from Eustace Pen, LPN sent at 4/62/7035  4:24 PM EDT ----- Regarding: labs 5/1 Lab orders needed. Thank you.  Insurance:  [employer]

## 2017-09-14 ENCOUNTER — Ambulatory Visit: Payer: PPO

## 2017-09-19 ENCOUNTER — Other Ambulatory Visit (INDEPENDENT_AMBULATORY_CARE_PROVIDER_SITE_OTHER): Payer: PPO

## 2017-09-19 DIAGNOSIS — R7303 Prediabetes: Secondary | ICD-10-CM | POA: Diagnosis not present

## 2017-09-19 DIAGNOSIS — E782 Mixed hyperlipidemia: Secondary | ICD-10-CM | POA: Diagnosis not present

## 2017-09-19 DIAGNOSIS — M810 Age-related osteoporosis without current pathological fracture: Secondary | ICD-10-CM | POA: Diagnosis not present

## 2017-09-19 LAB — COMPREHENSIVE METABOLIC PANEL
ALK PHOS: 34 U/L — AB (ref 39–117)
ALT: 14 U/L (ref 0–35)
AST: 19 U/L (ref 0–37)
Albumin: 4.4 g/dL (ref 3.5–5.2)
BUN: 15 mg/dL (ref 6–23)
CO2: 32 meq/L (ref 19–32)
Calcium: 9.9 mg/dL (ref 8.4–10.5)
Chloride: 92 mEq/L — ABNORMAL LOW (ref 96–112)
Creatinine, Ser: 0.7 mg/dL (ref 0.40–1.20)
GFR: 84.43 mL/min (ref >60.00)
GLUCOSE: 110 mg/dL — AB (ref 70–99)
POTASSIUM: 4.9 meq/L (ref 3.5–5.1)
Sodium: 133 mEq/L — ABNORMAL LOW (ref 135–145)
Total Bilirubin: 0.5 mg/dL (ref 0.2–1.2)
Total Protein: 6.8 g/dL (ref 6.0–8.3)

## 2017-09-19 LAB — LIPID PANEL
CHOL/HDL RATIO: 3
Cholesterol: 198 mg/dL (ref 0–200)
HDL: 75.1 mg/dL (ref >39.00)
LDL Cholesterol: 110 mg/dL — ABNORMAL HIGH (ref 0–99)
NONHDL: 123.26
Triglycerides: 67 mg/dL (ref 0.0–149.0)
VLDL: 13.4 mg/dL (ref 0.0–40.0)

## 2017-09-19 LAB — HEMOGLOBIN A1C: Hgb A1c MFr Bld: 6 % (ref 4.6–6.5)

## 2017-09-19 LAB — VITAMIN D 25 HYDROXY (VIT D DEFICIENCY, FRACTURES): VITD: 27.83 ng/mL — AB (ref 30.00–100.00)

## 2017-09-20 ENCOUNTER — Ambulatory Visit (INDEPENDENT_AMBULATORY_CARE_PROVIDER_SITE_OTHER): Payer: PPO

## 2017-09-20 VITALS — BP 100/78 | HR 59 | Temp 97.8°F | Ht 62.0 in | Wt 101.0 lb

## 2017-09-20 DIAGNOSIS — Z0000 Encounter for general adult medical examination without abnormal findings: Secondary | ICD-10-CM

## 2017-09-20 NOTE — Patient Instructions (Signed)
Meredith Kramer , Thank you for taking time to come for your Medicare Wellness Visit. I appreciate your ongoing commitment to your health goals. Please review the following plan we discussed and let me know if I can assist you in the future.   These are the goals we discussed: Goals    . Patient Stated     Starting 09/20/2017, I will continue to take medications as prescribed.        This is a list of the screening recommended for you and due dates:  Health Maintenance  Topic Date Due  . Mammogram  09/21/2018*  . Tetanus Vaccine  09/21/2018*  . Flu Shot  12/15/2017  . DEXA scan (bone density measurement)  Completed  . Pneumonia vaccines  Completed  *Topic was postponed. The date shown is not the original due date.   Preventive Care for Adults  A healthy lifestyle and preventive care can promote health and wellness. Preventive health guidelines for adults include the following key practices.  . A routine yearly physical is a good way to check with your health care provider about your health and preventive screening. It is a chance to share any concerns and updates on your health and to receive a thorough exam.  . Visit your dentist for a routine exam and preventive care every 6 months. Brush your teeth twice a day and floss once a day. Good oral hygiene prevents tooth decay and gum disease.  . The frequency of eye exams is based on your age, health, family medical history, use  of contact lenses, and other factors. Follow your health care provider's recommendations for frequency of eye exams.  . Eat a healthy diet. Foods like vegetables, fruits, whole grains, low-fat dairy products, and lean protein foods contain the nutrients you need without too many calories. Decrease your intake of foods high in solid fats, added sugars, and salt. Eat the right amount of calories for you. Get information about a proper diet from your health care provider, if necessary.  . Regular physical exercise is  one of the most important things you can do for your health. Most adults should get at least 150 minutes of moderate-intensity exercise (any activity that increases your heart rate and causes you to sweat) each week. In addition, most adults need muscle-strengthening exercises on 2 or more days a week.  Silver Sneakers may be a benefit available to you. To determine eligibility, you may visit the website: www.silversneakers.com or contact program at 4235297066 Mon-Fri between 8AM-8PM.   . Maintain a healthy weight. The body mass index (BMI) is a screening tool to identify possible weight problems. It provides an estimate of body fat based on height and weight. Your health care provider can find your BMI and can help you achieve or maintain a healthy weight.   For adults 20 years and older: ? A BMI below 18.5 is considered underweight. ? A BMI of 18.5 to 24.9 is normal. ? A BMI of 25 to 29.9 is considered overweight. ? A BMI of 30 and above is considered obese.   . Maintain normal blood lipids and cholesterol levels by exercising and minimizing your intake of saturated fat. Eat a balanced diet with plenty of fruit and vegetables. Blood tests for lipids and cholesterol should begin at age 57 and be repeated every 5 years. If your lipid or cholesterol levels are high, you are over 50, or you are at high risk for heart disease, you may need your cholesterol levels  checked more frequently. Ongoing high lipid and cholesterol levels should be treated with medicines if diet and exercise are not working.  . If you smoke, find out from your health care provider how to quit. If you do not use tobacco, please do not start.  . If you choose to drink alcohol, please do not consume more than 2 drinks per day. One drink is considered to be 12 ounces (355 mL) of beer, 5 ounces (148 mL) of wine, or 1.5 ounces (44 mL) of liquor.  . If you are 64-59 years old, ask your health care provider if you should take  aspirin to prevent strokes.  . Use sunscreen. Apply sunscreen liberally and repeatedly throughout the day. You should seek shade when your shadow is shorter than you. Protect yourself by wearing long sleeves, pants, a wide-brimmed hat, and sunglasses year round, whenever you are outdoors.  . Once a month, do a whole body skin exam, using a mirror to look at the skin on your back. Tell your health care provider of new moles, moles that have irregular borders, moles that are larger than a pencil eraser, or moles that have changed in shape or color.

## 2017-09-20 NOTE — Progress Notes (Signed)
PCP notes:   Health maintenance:  Mammogram - per pt, she will no longer do exam Tetanus vaccine - postponed/insurance  Abnormal screenings:   Hearing - failed  Hearing Screening   125Hz  250Hz  500Hz  1000Hz  2000Hz  3000Hz  4000Hz  6000Hz  8000Hz   Right ear:   0 0 40  0    Left ear:   0 0 40  0     Mini-Cog score: 18/20 MMSE - Mini Mental State Exam 09/20/2017 09/10/2016  Orientation to time 5 5  Orientation to Place 5 5  Registration 3 3  Attention/ Calculation 0 0  Recall 1 3  Recall-comments unable to recall 2 of 3 words -  Language- name 2 objects 0 0  Language- repeat 1 1  Language- follow 3 step command 3 3  Language- read & follow direction 0 0  Write a sentence 0 0  Copy design 0 0  Total score 18 20    Patient concerns:   Patient reports concerns with mid chest pain and urinary frequency at night. PCP notified. Patient was educated about the signs and symptoms of a heart attack.   Nurse concerns:  None  Next PCP appt:   09/23/17 @ 1600

## 2017-09-20 NOTE — Progress Notes (Signed)
Subjective:   Meredith Kramer is a 84 y.o. female who presents for Medicare Annual (Subsequent) preventive examination.  Review of Systems:  N/A Cardiac Risk Factors include: advanced age (>19men, >32 women);dyslipidemia     Objective:     Vitals: BP 100/78 (BP Location: Right Arm, Patient Position: Sitting, Cuff Size: Normal)   Pulse (!) 59   Temp 97.8 F (36.6 C) (Oral)   Ht 5\' 2"  (1.575 m) Comment: shoes  Wt 101 lb (45.8 kg)   SpO2 99%   BMI 18.47 kg/m   Body mass index is 18.47 kg/m.  Advanced Directives 09/20/2017 09/10/2016  Does Patient Have a Medical Advance Directive? Yes Yes  Type of Paramedic of [place];Living will [place];Living will  Copy of [place] in Chart? No - copy requested No - copy requested    Tobacco Social History   Tobacco Use  Smoking Status Never Smoker  Smokeless Tobacco Never Used     Counseling given: No   Clinical Intake:  Pre-visit preparation completed: Yes  Pain : No/denies pain Pain Score: 0-No pain     Nutritional Status: BMI of 19-24  Normal Nutritional Risks: None Diabetes: No  How often do you need to have someone help you when you read instructions, pamphlets, or other written materials from your doctor or pharmacy?: 1 - Never What is the last grade level you completed in school?: 12th grade  Interpreter Needed?: No  Comments: pt is a widow and lives alone Information entered by :: LPinson, LPN  Past Medical History:  Diagnosis Date  . Hyperlipidemia   . Osteopenia    Past Surgical History:  Procedure Laterality Date  . TONSILLECTOMY     Family History  Problem Relation Age of Onset  . Stroke Mother        age 9  . Emphysema Father        [employer]  . Coronary artery disease Father        MI age 5  . Coronary artery disease Brother        stent  . Cancer Neg Hx    Social History   Socioeconomic History  . Marital status:  Married    Spouse name: Not on file  . Number of children: Not on file  . Years of education: Not on file  . Highest education level: Not on file  Occupational History  . Not on file  Social Needs  . Financial resource strain: Not on file  . Food insecurity:    Worry: Not on file    Inability: Not on file  . Transportation needs:    Medical: Not on file    Non-medical: Not on file  Tobacco Use  . Smoking status: Never Smoker  . Smokeless tobacco: Never Used  Substance and Sexual Activity  . Alcohol use: No  . Drug use: No  . Sexual activity: Not Currently  Lifestyle  . Physical activity:    Days per week: Not on file    Minutes per session: Not on file  . Stress: Not on file  Relationships  . Social connections:    Talks on phone: Not on file    Gets together: Not on file    Attends religious service: Not on file    Active member of club or organization: Not on file    Attends meetings of clubs or organizations: Not on file    Relationship status: Not on  file  Other Topics Concern  . Not on file  Social History Narrative   Regular exercise- yes.  Going to jazz class 4 days a week, daily walk.   Diet- fruits and veggies, milk, water.    Outpatient Encounter Medications as of 09/20/2017  Medication Sig  . acetaminophen (TYLENOL) 500 MG tablet Take by mouth as needed   . alendronate (FOSAMAX) 70 MG tablet Take 1 tablet (70 mg total) by mouth every 7 (seven) days. Take with a full glass of water on an empty stomach.  . Calcium Carbonate-Vitamin D (CALTRATE 600+D) 600-400 MG-UNIT per tablet Take 1 tablet by mouth daily.   . diphenhydrAMINE (SOMINEX) 25 MG tablet Take 25 mg by mouth at bedtime as needed.     No facility-administered encounter medications on file as of 09/20/2017.     Activities of Daily Living In your present state of health, do you have any difficulty performing the following activities: 09/20/2017  Hearing? Y  Vision? N  Difficulty concentrating or  making decisions? Y  Walking or climbing stairs? N  Dressing or bathing? N  Doing errands, shopping? N  Preparing Food and eating ? N  Using the Toilet? N  In the past six months, have you accidently leaked urine? N  Do you have problems with loss of bowel control? N  Managing your Medications? N  Managing your Finances? N  Housekeeping or managing your Housekeeping? N  Some recent data might be hidden    Patient Care Team: Jinny Sanders, MD as PCP - General    Assessment:   This is a routine wellness examination for Memory.   Hearing Screening   125Hz  250Hz  500Hz  1000Hz  2000Hz  3000Hz  4000Hz  6000Hz  8000Hz   Right ear:   0 0 40  0    Left ear:   0 0 40  0    Vision Screening Comments: Last vision exam in 2019 @ [facility]   Exercise Activities and Dietary recommendations Current Exercise Habits: The patient does not participate in regular exercise at present, Exercise limited by: None identified  Goals    . Patient Stated     Starting 09/20/2017, I will continue to take medications as prescribed.        Fall Risk Fall Risk  09/20/2017 09/10/2016 07/18/2015 07/11/2014 12/01/2012  Falls in the past year? No No No No No    Depression Screen PHQ 2/9 Scores 09/20/2017 09/10/2016 07/18/2015 07/11/2014  PHQ - 2 Score 0 0 0 0  PHQ- 9 Score 0 - - -     Cognitive Function MMSE - Mini Mental State Exam 09/20/2017 09/10/2016  Orientation to time 5 5  Orientation to Place 5 5  Registration 3 3  Attention/ Calculation 0 0  Recall 1 3  Recall-comments unable to recall 2 of 3 words -  Language- name 2 objects 0 0  Language- repeat 1 1  Language- follow 3 step command 3 3  Language- read & follow direction 0 0  Write a sentence 0 0  Copy design 0 0  Total score 18 20     PLEASE NOTE: A Mini-Cog screen was completed. Maximum score is 20. A value of 0 denotes this part of Folstein MMSE was not completed or the patient failed this part of the Mini-Cog screening.   Mini-Cog  Screening Orientation to Time - Max 5 pts Orientation to Place - Max 5 pts Registration - Max 3 pts Recall - Max 3 pts Language Repeat - Max  1 pts Language Follow 3 Step Command - Max 3 pts     Immunization History  Administered Date(s) Administered  . Influenza Split 03/01/2012  . Influenza Whole 04/28/2007, 02/20/2008, 01/28/2010  . Influenza, High Dose Seasonal PF 02/20/2014, 02/15/2015, 03/10/2016  . Influenza,inj,Quad PF,6+ Mos 02/01/2017  . Pneumococcal Conjugate-13 07/11/2014  . Pneumococcal Polysaccharide-23 04/24/2010  . Td 04/28/2007  . Zoster 04/24/2010   Screening Tests Health Maintenance  Topic Date Due  . MAMMOGRAM  09/21/2018 (Originally 07/13/2016)  . TETANUS/TDAP  09/21/2018 (Originally 04/27/2017)  . INFLUENZA VACCINE  12/15/2017  . DEXA SCAN  Completed  . PNA vac Low Risk Adult  Completed       Plan:     I have personally reviewed, addressed, and noted the following in the patient's chart:  A. Medical and social history B. Use of alcohol, tobacco or illicit drugs  C. Current medications and supplements D. Functional ability and status E.  Nutritional status F.  Physical activity G. Advance directives H. List of other physicians I.  Hospitalizations, surgeries, and ER visits in previous 12 months J.  [place] to include hearing, vision, cognitive, depression L. Referrals and appointments - none  In addition, I have reviewed and discussed with patient certain preventive protocols, quality metrics, and best practice recommendations. A written personalized care plan for preventive services as well as general preventive health recommendations were provided to patient.  See attached scanned questionnaire for additional information.   Signed,   Lindell Noe, MHA, BS, LPN Health Coach

## 2017-09-22 NOTE — Progress Notes (Signed)
I reviewed health advisor's note, was available for consultation, and agree with documentation and plan.  

## 2017-09-23 ENCOUNTER — Ambulatory Visit (INDEPENDENT_AMBULATORY_CARE_PROVIDER_SITE_OTHER): Payer: PPO | Admitting: Family Medicine

## 2017-09-23 ENCOUNTER — Other Ambulatory Visit: Payer: Self-pay

## 2017-09-23 ENCOUNTER — Encounter: Payer: Self-pay | Admitting: Family Medicine

## 2017-09-23 VITALS — BP 138/60 | HR 65 | Temp 98.5°F | Ht 62.0 in | Wt 103.5 lb

## 2017-09-23 DIAGNOSIS — Z85820 Personal history of malignant melanoma of skin: Secondary | ICD-10-CM | POA: Diagnosis not present

## 2017-09-23 DIAGNOSIS — Z6820 Body mass index (BMI) 20.0-20.9, adult: Secondary | ICD-10-CM | POA: Insufficient documentation

## 2017-09-23 DIAGNOSIS — R636 Underweight: Secondary | ICD-10-CM | POA: Diagnosis not present

## 2017-09-23 DIAGNOSIS — R079 Chest pain, unspecified: Secondary | ICD-10-CM | POA: Diagnosis not present

## 2017-09-23 DIAGNOSIS — R350 Frequency of micturition: Secondary | ICD-10-CM | POA: Diagnosis not present

## 2017-09-23 DIAGNOSIS — R7303 Prediabetes: Secondary | ICD-10-CM

## 2017-09-23 DIAGNOSIS — N3281 Overactive bladder: Secondary | ICD-10-CM | POA: Diagnosis not present

## 2017-09-23 DIAGNOSIS — Z0000 Encounter for general adult medical examination without abnormal findings: Secondary | ICD-10-CM

## 2017-09-23 DIAGNOSIS — E782 Mixed hyperlipidemia: Secondary | ICD-10-CM

## 2017-09-23 MED ORDER — MIRABEGRON ER 25 MG PO TB24: 25.0000 mg | ORAL_TABLET | Freq: Every day | ORAL | 5 refills | Status: DC

## 2017-09-23 NOTE — Patient Instructions (Addendum)
Can try a trial of Myrbetriq at bedtime for overactive bladder.  Call if not helping with urinary frequency after 8 weeks.  Increase  caltrate to 2 times daily for next few months.  Do not restrict salt.  Consider glucerna shakes.  Call if chest pain increasing in frequency, strength or associated with shortness of breath or exertion.

## 2017-09-23 NOTE — Assessment & Plan Note (Signed)
Add glucerna shakes.

## 2017-09-23 NOTE — Progress Notes (Signed)
Subjective:    Patient ID: Meredith Kramer, female    DOB: 05-Nov-1929, 84 y.o.   MRN: [id number]  HPI  The patient presents for  complete physical and review of chronic health problems. He/She also has the following acute concerns today: chest pain  The patient saw Candis Musa, LPN for medicare wellness. Note reviewed in detail and important notes copied below.  Health maintenance:  Mammogram - per pt, she will no longer do exam Tetanus vaccine - postponed/insurance  Abnormal screenings:   Hearing - failed             Hearing Screening   125Hz  250Hz  500Hz  1000Hz  2000Hz  3000Hz  4000Hz  6000Hz  8000Hz   Right ear:   0 0 40  0    Left ear:   0 0 40  0     Mini-Cog score: 18/20 MMSE - Mini Mental State Exam 09/20/2017 09/10/2016  Orientation to time 5 5  Orientation to Place 5 5  Registration 3 3  Attention/ Calculation 0 0  Recall 1 3  Recall-comments unable to recall 2 of 3 words -  Language- name 2 objects 0 0  Language- repeat 1 1  Language- follow 3 step command 3 3  Language- read & follow direction 0 0  Write a sentence 0 0  Copy design 0 0  Total score 18 20    Patient concerns:   Patient reports concerns with mid chest pain and urinary frequency at night. PCP notified. Patient was educated about the signs and symptoms of a heart attack.    09/23/17 Today:   She states she is bothered by hearing issues.. Plans to consider hearing eval and hearing aides.   She has noted some decline in memory gradually in last few years.  Memory test was slightly abnormal.  Noone has commented on memory issues with her.  Only issue was recall.   OAB: trial of oxybutynin in past.  Ongoing for years, worse in last year.  3-4 nocturia in past, during the day  Has to urinate every 1-3 hours.  no dysuria, no blood , no abd pain.   Chest pain, new: started in last few months. One episode central chest pain 1 month ago while,  not exerting herself. No  dizziness, no SOB. Resolved after sitting for a few minutes. No associated symptoms.  Pain recurred in last week, resolved with rest, after few minutes.  No pain with walking the dog.  No relationship to eating.  No past cardiac eval.   Prediabetes:  Improved with lower carb diet. Lab Results  Component Value Date   HGBA1C 6.0 09/19/2017     Hyperlipidemia:  Good control LDl and HDL high on no medication. Lab Results  Component Value Date   CHOL 198 09/19/2017   HDL 75.10 09/19/2017   LDLCALC 110 (H) 09/19/2017   TRIG 67.0 09/19/2017   CHOLHDL 3 09/19/2017    Melanoma history:  Followed by  Payton Mccallum. Last seen few weeks ago. Healing biopsy sites.  Urinary urge incontinence:  Oxybutynin .Marland Kitchen minimaly helpful.   She has been trying to eat healthier diet. She has good appetite.   Vit d def: low on caltrate daily  Hyponatremia mild.. Plan no restriction. Wt Readings from Last 3 Encounters:  09/23/17 103 lb 8 oz (46.9 kg)  09/20/17 101 lb (45.8 kg)  02/01/17 101 lb 4 oz (45.9 kg)   Body mass index is 18.93 kg/m.    Social History /Family History/Past Medical History reviewed  in detail and updated in EMR if needed. Blood pressure 138/60, pulse 65, temperature 98.5 F (36.9 C), temperature source Oral, height 5\' 2"  (1.575 m), weight 103 lb 8 oz (46.9 kg).   Review of Systems  Constitutional: Negative for fatigue and fever.  HENT: Negative for congestion.   Eyes: Negative for pain.  Respiratory: Negative for cough and shortness of breath.   Cardiovascular: Positive for chest pain. Negative for palpitations and leg swelling.  Gastrointestinal: Negative for abdominal pain.  Genitourinary: Negative for dysuria and vaginal bleeding.  Musculoskeletal: Negative for back pain.  Neurological: Negative for syncope, light-headedness and headaches.  Psychiatric/Behavioral: Negative for dysphoric mood.       Objective:   Physical Exam  Constitutional: Vital signs are normal.  She appears well-developed and well-nourished. She is cooperative.  Non-toxic appearance. She does not appear ill. No distress.  HENT:  Head: Normocephalic.  Right Ear: Hearing, tympanic membrane, external ear and ear canal normal. Tympanic membrane is not erythematous, not retracted and not bulging.  Left Ear: Hearing, tympanic membrane, external ear and ear canal normal. Tympanic membrane is not erythematous, not retracted and not bulging.  Nose: No mucosal edema or rhinorrhea. Right sinus exhibits no maxillary sinus tenderness and no frontal sinus tenderness. Left sinus exhibits no maxillary sinus tenderness and no frontal sinus tenderness.  Mouth/Throat: Uvula is midline, oropharynx is clear and moist and mucous membranes are normal.  Eyes: Pupils are equal, round, and reactive to light. Conjunctivae, EOM and lids are normal. Lids are everted and swept, no foreign bodies found.  Neck: Trachea normal and normal range of motion. Neck supple. Carotid bruit is not present. No thyroid mass and no thyromegaly present.  Cardiovascular: Normal rate, regular rhythm, S1 normal, S2 normal, normal heart sounds, intact distal pulses and normal pulses. Exam reveals no gallop and no friction rub.  No murmur heard. Pulmonary/Chest: Effort normal and breath sounds normal. No tachypnea. No respiratory distress. She has no decreased breath sounds. She has no wheezes. She has no rhonchi. She has no rales.  Abdominal: Soft. Normal appearance and bowel sounds are normal. There is no tenderness.  Neurological: She is alert.  Skin: Skin is warm, dry and intact. No rash noted.  Psychiatric: Her speech is normal and behavior is normal. Judgment and thought content normal. Her mood appears not anxious. Cognition and memory are normal. She does not exhibit a depressed mood.          Assessment & Plan:  The patient's preventative maintenance and recommended screening tests for an annual wellness exam were reviewed in  full today. Brought up to date unless services declined.  Counselled on the importance of diet, exercise, and its role in overall health and mortality. The patient's FH and SH was reviewed, including their home life, tobacco status, and drug and alcohol status.   Vaccines: uptodate  DEXA: 2018  Continued osteoporosis off fosamax. Due for repeat in 2020. Mammo: 07/14/2015, refused repeat Colon: 07/2005 Dr. Fuller Plan...adenomatous polyps, Not indicated  Pap/DVE: not indicated  Nonsmoker  EKG: Normal sinus rhythm. Normal axis, normal R wave progression, No acute ST elevation or depression.

## 2017-10-27 DIAGNOSIS — R079 Chest pain, unspecified: Secondary | ICD-10-CM | POA: Insufficient documentation

## 2017-10-27 NOTE — Assessment & Plan Note (Signed)
Good control with diet,on no medication. HDL protective.

## 2017-10-27 NOTE — Assessment & Plan Note (Signed)
EKG unremarkable. If recurrent call for further eval.

## 2017-10-27 NOTE — Assessment & Plan Note (Signed)
Stable control. Encouraged exercise, weight loss, healthy eating habits.  

## 2017-10-27 NOTE — Assessment & Plan Note (Signed)
trial of Myrbetriq at bedtime

## 2017-12-13 DIAGNOSIS — Z08 Encounter for follow-up examination after completed treatment for malignant neoplasm: Secondary | ICD-10-CM | POA: Diagnosis not present

## 2017-12-13 DIAGNOSIS — Z85820 Personal history of malignant melanoma of skin: Secondary | ICD-10-CM | POA: Diagnosis not present

## 2017-12-13 DIAGNOSIS — Z85828 Personal history of other malignant neoplasm of skin: Secondary | ICD-10-CM | POA: Diagnosis not present

## 2017-12-13 DIAGNOSIS — D2261 Melanocytic nevi of right upper limb, including shoulder: Secondary | ICD-10-CM | POA: Diagnosis not present

## 2017-12-13 DIAGNOSIS — D2272 Melanocytic nevi of left lower limb, including hip: Secondary | ICD-10-CM | POA: Diagnosis not present

## 2017-12-13 DIAGNOSIS — D2271 Melanocytic nevi of right lower limb, including hip: Secondary | ICD-10-CM | POA: Diagnosis not present

## 2017-12-13 DIAGNOSIS — L821 Other seborrheic keratosis: Secondary | ICD-10-CM | POA: Diagnosis not present

## 2017-12-13 DIAGNOSIS — D2262 Melanocytic nevi of left upper limb, including shoulder: Secondary | ICD-10-CM | POA: Diagnosis not present

## 2018-02-21 ENCOUNTER — Other Ambulatory Visit: Payer: Self-pay | Admitting: Family Medicine

## 2018-02-28 ENCOUNTER — Ambulatory Visit: Payer: PPO | Admitting: Family Medicine

## 2018-03-22 ENCOUNTER — Encounter: Payer: Self-pay | Admitting: Internal Medicine

## 2018-03-22 ENCOUNTER — Ambulatory Visit (INDEPENDENT_AMBULATORY_CARE_PROVIDER_SITE_OTHER): Payer: PPO | Admitting: Internal Medicine

## 2018-03-22 VITALS — BP 124/70 | HR 63 | Temp 98.4°F | Ht 62.0 in | Wt 105.0 lb

## 2018-03-22 DIAGNOSIS — R079 Chest pain, unspecified: Secondary | ICD-10-CM | POA: Diagnosis not present

## 2018-03-22 NOTE — Assessment & Plan Note (Signed)
3rd recurrence of atypical and brief pain in mid chest (under sternum ~T3-4) No localized tenderness Not clearly related to fosamax and not food related No regular heartburn Doesn't seem to be coronary ischemia---[facility] and [facility] Nothing to suggest PE No associated symptoms Would consider stress test or cardiology evaluation if recurs Discussed 911 if recurs and doesn't resolve quickly

## 2018-03-22 NOTE — Progress Notes (Signed)
Subjective:    Patient ID: Meredith Kramer, female    DOB: 08/23/1929, 84 y.o.   MRN: [id number]  HPI Here due to recurrence of chest pain Reviewed Dr Sharmaine Base note from May  Has only happened 3 times--several months apart Points substernal--and is scary Lasts a few minutes Last spell yesterday or day before Very "uncomfortable" but hard to describe No SOB, nausea, diaphoresis Occurred when "piddling around in kitchen"  [place] and sat in chair when it came on [place] away in "short while" No dizziness Doesn't seem to be related to eating Doesn't radiate  Has been doing exercises three times a week for [facility] No pain or SOB during this  Current Outpatient Medications on File Prior to Visit  Medication Sig Dispense Refill  . acetaminophen (TYLENOL) 500 MG tablet Take by mouth as needed     . alendronate (FOSAMAX) 70 MG tablet TAKE 1 TABLET BY MOUTH EVERY 7 DAYS TAKE ON EMPTY STOMACH WITH FULL GLASS OF WATER 12 tablet 1  . Calcium Carbonate-Vitamin D (CALTRATE 600+D) 600-400 MG-UNIT per tablet Take 1 tablet by mouth daily.     . diphenhydrAMINE (SOMINEX) 25 MG tablet Take 25 mg by mouth at bedtime as needed.      . mirabegron ER (MYRBETRIQ) 25 MG TB24 tablet Take 1 tablet (25 mg total) by mouth daily. (Patient not taking: Reported on 03/22/2018) 30 tablet 5   No current facility-administered medications on file prior to visit.     Allergies  Allergen Reactions  . Doxycycline Hyclate     REACTION: rash  . Erythromycin Base     REACTION: rash  . Penicillins     REACTION: rash    Past Medical History:  Diagnosis Date  . Hyperlipidemia   . Osteopenia     Past Surgical History:  Procedure Laterality Date  . TONSILLECTOMY      Family History  Problem Relation Age of Onset  . Stroke Mother        age 45  . Emphysema Father        [employer]  . Coronary artery disease Father        MI age 50  . Coronary artery disease Brother        stent    . Cancer Neg Hx     Social History   Socioeconomic History  . Marital status: Married    Spouse name: Not on file  . Number of children: Not on file  . Years of education: Not on file  . Highest education level: Not on file  Occupational History  . Not on file  Social Needs  . Financial resource strain: Not on file  . Food insecurity:    Worry: Not on file    Inability: Not on file  . Transportation needs:    Medical: Not on file    Non-medical: Not on file  Tobacco Use  . Smoking status: Never Smoker  . Smokeless tobacco: Never Used  Substance and Sexual Activity  . Alcohol use: No  . Drug use: No  . Sexual activity: Not Currently  Lifestyle  . Physical activity:    Days per week: Not on file    Minutes per session: Not on file  . Stress: Not on file  Relationships  . Social connections:    Talks on phone: Not on file    Gets together: Not on file    Attends religious service: Not on  file    Active member of club or organization: Not on file    Attends meetings of clubs or organizations: Not on file    Relationship status: Not on file  . Intimate partner violence:    Fear of current or ex partner: Not on file    Emotionally abused: Not on file    Physically abused: Not on file    Forced sexual activity: Not on file  Other Topics Concern  . Not on file  Social History Narrative   Regular exercise- yes.  Going to jazz class 4 days a week, daily walk.   Diet- fruits and veggies, milk, water.   Review of Systems No dysphagia Appetite is fine Doesn't notice heartburn Not sure about fosamax---thinks she took it today    Objective:   Physical Exam  Constitutional: She appears well-developed. No distress.  Neck: No thyromegaly present.  Cardiovascular: Normal rate, regular rhythm and normal heart sounds. Exam reveals no gallop.  No murmur heard. Respiratory: Effort normal and breath sounds normal. No respiratory distress. She has no wheezes. She has no  rales.   No sternal tenderness  GI: Soft. She exhibits no distension. There is no tenderness. There is no rebound and no guarding.  Musculoskeletal: She exhibits no edema.  Lymphadenopathy:    She has no cervical adenopathy.           Assessment & Plan:

## 2018-04-07 ENCOUNTER — Ambulatory Visit (INDEPENDENT_AMBULATORY_CARE_PROVIDER_SITE_OTHER): Payer: PPO | Admitting: Family Medicine

## 2018-04-07 ENCOUNTER — Encounter: Payer: Self-pay | Admitting: Family Medicine

## 2018-04-07 VITALS — BP 164/64 | HR 62 | Temp 98.3°F | Ht 62.0 in | Wt 104.8 lb

## 2018-04-07 DIAGNOSIS — N3281 Overactive bladder: Secondary | ICD-10-CM

## 2018-04-07 DIAGNOSIS — I10 Essential (primary) hypertension: Secondary | ICD-10-CM | POA: Diagnosis not present

## 2018-04-07 MED ORDER — MIRABEGRON ER 25 MG PO TB24: 25.0000 mg | ORAL_TABLET | Freq: Every day | ORAL | 5 refills | Status: DC

## 2018-04-07 NOTE — Patient Instructions (Signed)
Get  Blood pressure cuff .Marland Kitchen Follow BP at home daily.. Call in 1-2 weeks with measurements.

## 2018-04-07 NOTE — Progress Notes (Signed)
Subjective:    Patient ID: Meredith Kramer, female    DOB: 12-19-1929, 84 y.o.   MRN: [id number]  HPI 84 year old female presents for 6 month follow up.   She reports she had no more chest pain. She is not short of breath.  He blood pressure is usually normal but is very high today.  She does not check her BP at home.  No headache, no blurred vision. No new meds, no decongestants,   no blurred vision.  no new numbness, no weakness.  no new urine change. Went to exercise this AM.. No swelling in ankles.  No pain. BP Readings from Last 3 Encounters:  04/07/18 (!) 170/62  03/22/18 124/70  09/23/17 138/60   Urge incontinece : myrbetriq helped. .  Blood pressure (!) 170/62, pulse 62, temperature 98.3 F (36.8 C), temperature source Oral, height 5\' 2"  (1.575 m), weight 104 lb 12 oz (47.5 kg), SpO2 99 %.   Review of Systems  Constitutional: Negative for fatigue and fever.  HENT: Negative for congestion.   Eyes: Negative for pain.  Respiratory: Negative for cough and shortness of breath.   Cardiovascular: Negative for chest pain, palpitations and leg swelling.  Gastrointestinal: Negative for abdominal pain.  Genitourinary: Negative for dysuria and vaginal bleeding.  Musculoskeletal: Negative for back pain.  Neurological: Negative for syncope, light-headedness and headaches.  Psychiatric/Behavioral: Negative for dysphoric mood.       Objective:   Physical Exam Constitutional:      General: She is not in acute distress.    Appearance: Normal appearance. She is well-developed. She is not ill-appearing or toxic-appearing.     Comments: Elderly female in [place]  HENT:     Head: Normocephalic.     Right Ear: Hearing, tympanic membrane, ear canal and external ear normal. Tympanic membrane is not erythematous, retracted or bulging.     Left Ear: Hearing, tympanic membrane, ear canal and external ear normal. Tympanic membrane is not erythematous, retracted or bulging.   Nose: No mucosal edema or rhinorrhea.     Right Sinus: No maxillary sinus tenderness or frontal sinus tenderness.     Left Sinus: No maxillary sinus tenderness or frontal sinus tenderness.     Mouth/Throat:     Pharynx: Uvula midline.  Eyes:     General: Lids are normal. Lids are everted, no foreign bodies appreciated.     Conjunctiva/sclera: Conjunctivae normal.     Pupils: Pupils are equal, round, and reactive to light.  Neck:     Musculoskeletal: Normal range of motion and neck supple.     Thyroid: No thyroid mass or thyromegaly.     Vascular: No carotid bruit.     Trachea: Trachea normal.  Cardiovascular:     Rate and Rhythm: Normal rate and regular rhythm.     Pulses: Normal pulses.     Heart sounds: Normal heart sounds, S1 normal and S2 normal. No murmur. No friction rub. No gallop.   Pulmonary:     Effort: Pulmonary effort is normal. No tachypnea or respiratory distress.     Breath sounds: Normal breath sounds. No decreased breath sounds, wheezing, rhonchi or rales.  Abdominal:     General: Bowel sounds are normal.     Palpations: Abdomen is soft.     Tenderness: There is no abdominal tenderness.  Skin:    General: Skin is warm and dry.     Findings: No rash.  Neurological:     Mental Status: She is  alert.  Psychiatric:        Mood and Affect: Mood is not anxious or depressed.        Speech: Speech normal.        Behavior: Behavior normal. Behavior is cooperative.        Thought Content: Thought content normal.        Judgment: Judgment normal.           Assessment & Plan:

## 2018-04-21 ENCOUNTER — Emergency Department: Payer: PPO

## 2018-04-21 ENCOUNTER — Telehealth: Payer: Self-pay | Admitting: *Deleted

## 2018-04-21 ENCOUNTER — Other Ambulatory Visit: Payer: Self-pay

## 2018-04-21 ENCOUNTER — Encounter: Payer: Self-pay | Admitting: *Deleted

## 2018-04-21 ENCOUNTER — Observation Stay
Admission: EM | Admit: 2018-04-21 | Discharge: 2018-04-22 | Disposition: A | Discharge status: Home / Self Care | Payer: PPO | Attending: Internal Medicine | Admitting: Internal Medicine

## 2018-04-21 DIAGNOSIS — M810 Age-related osteoporosis without current pathological fracture: Secondary | ICD-10-CM | POA: Diagnosis not present

## 2018-04-21 DIAGNOSIS — I160 Hypertensive urgency: Principal | ICD-10-CM | POA: Insufficient documentation

## 2018-04-21 DIAGNOSIS — I10 Essential (primary) hypertension: Secondary | ICD-10-CM | POA: Diagnosis not present

## 2018-04-21 DIAGNOSIS — E785 Hyperlipidemia, unspecified: Secondary | ICD-10-CM | POA: Diagnosis not present

## 2018-04-21 DIAGNOSIS — R4182 Altered mental status, unspecified: Secondary | ICD-10-CM | POA: Diagnosis not present

## 2018-04-21 DIAGNOSIS — R030 Elevated blood-pressure reading, without diagnosis of hypertension: Secondary | ICD-10-CM | POA: Diagnosis present

## 2018-04-21 DIAGNOSIS — Z79899 Other long term (current) drug therapy: Secondary | ICD-10-CM | POA: Diagnosis not present

## 2018-04-21 DIAGNOSIS — R42 Dizziness and giddiness: Secondary | ICD-10-CM | POA: Diagnosis present

## 2018-04-21 LAB — URINALYSIS, COMPLETE (UACMP) WITH MICROSCOPIC
Bacteria, UA: NONE SEEN
Bilirubin Urine: NEGATIVE
Glucose, UA: NEGATIVE mg/dL
Hgb urine dipstick: NEGATIVE
Ketones, ur: 5 mg/dL — AB
Leukocytes, UA: NEGATIVE
Nitrite: NEGATIVE
Protein, ur: 30 mg/dL — AB
Specific Gravity, Urine: 1.011 (ref 1.005–1.030)
pH: 7 (ref 5.0–8.0)

## 2018-04-21 LAB — BASIC METABOLIC PANEL WITH GFR
Anion gap: 8 (ref 5–15)
BUN: 13 mg/dL (ref 8–23)
CO2: 29 mmol/L (ref 22–32)
Calcium: 9.1 mg/dL (ref 8.9–10.3)
Chloride: 94 mmol/L — ABNORMAL LOW (ref 98–111)
Creatinine, Ser: 0.6 mg/dL (ref 0.44–1.00)
GFR calc Af Amer: >60 mL/min (ref >60)
GFR calc non Af Amer: >60 mL/min (ref >60)
Glucose, Bld: 119 mg/dL — ABNORMAL HIGH (ref 70–99)
Potassium: 4 mmol/L (ref 3.5–5.1)
Sodium: 131 mmol/L — ABNORMAL LOW (ref 135–145)

## 2018-04-21 LAB — CBC
HCT: 41.4 % (ref 36.0–46.0)
Hemoglobin: 13.7 g/dL (ref 12.0–15.0)
MCH: 30 pg (ref 26.0–34.0)
MCHC: 33.1 g/dL (ref 30.0–36.0)
MCV: 90.8 fL (ref 80.0–100.0)
PLATELETS: 236 10*3/uL (ref 150–400)
RBC: 4.56 MIL/uL (ref 3.87–5.11)
RDW: 12.8 % (ref 11.5–15.5)
WBC: 6.4 10*3/uL (ref 4.0–10.5)
nRBC: 0 % (ref 0.0–0.2)

## 2018-04-21 LAB — TROPONIN I: Troponin I: <0.03 ng/mL (ref <0.03)

## 2018-04-21 MED ORDER — HYDRALAZINE HCL 20 MG/ML IJ SOLN
10.0000 mg | Freq: Four times a day (QID) | INTRAMUSCULAR | Status: DC | PRN
  Filled 2018-04-21: qty 1

## 2018-04-21 MED ORDER — ACETAMINOPHEN 650 MG RE SUPP: 650.0000 mg | Freq: Four times a day (QID) | RECTAL | Status: DC | PRN

## 2018-04-21 MED ORDER — LISINOPRIL 10 MG PO TABS
10.0000 mg | ORAL_TABLET | Freq: Every day | ORAL | Status: DC
  Administered 2018-04-21 – 2018-04-22 (×2): 10 mg via ORAL
  Filled 2018-04-21 (×2): qty 1

## 2018-04-21 MED ORDER — ONDANSETRON HCL 4 MG PO TABS: 4.0000 mg | ORAL_TABLET | Freq: Four times a day (QID) | ORAL | Status: DC | PRN

## 2018-04-21 MED ORDER — LABETALOL HCL 5 MG/ML IV SOLN: 10.0000 mg | INTRAVENOUS | Status: DC | PRN

## 2018-04-21 MED ORDER — ACETAMINOPHEN 325 MG PO TABS: 650.0000 mg | ORAL_TABLET | Freq: Four times a day (QID) | ORAL | Status: DC | PRN

## 2018-04-21 MED ORDER — HYDRALAZINE HCL 20 MG/ML IJ SOLN
5.0000 mg | Freq: Once | INTRAMUSCULAR | Status: AC
  Administered 2018-04-21: 5 mg via INTRAVENOUS
  Filled 2018-04-21: qty 1

## 2018-04-21 MED ORDER — ENOXAPARIN SODIUM 40 MG/0.4ML ~~LOC~~ SOLN
40.0000 mg | SUBCUTANEOUS | Status: DC
  Administered 2018-04-21: 22:00:00 40 mg via SUBCUTANEOUS
  Filled 2018-04-21: qty 0.4

## 2018-04-21 MED ORDER — MIRABEGRON ER 25 MG PO TB24
25.0000 mg | ORAL_TABLET | Freq: Every day | ORAL | Status: DC
  Administered 2018-04-22: 25 mg via ORAL
  Filled 2018-04-21: qty 1

## 2018-04-21 MED ORDER — HYDROCHLOROTHIAZIDE 25 MG PO TABS
25.0000 mg | ORAL_TABLET | Freq: Every day | ORAL | Status: DC
  Administered 2018-04-21 – 2018-04-22 (×2): 25 mg via ORAL
  Filled 2018-04-21 (×2): qty 1

## 2018-04-21 MED ORDER — AMLODIPINE BESYLATE 5 MG PO TABS
5.0000 mg | ORAL_TABLET | Freq: Once | ORAL | Status: AC
  Administered 2018-04-21: 5 mg via ORAL
  Filled 2018-04-21: qty 1

## 2018-04-21 MED ORDER — CALCIUM CARBONATE-VITAMIN D 500-200 MG-UNIT PO TABS
1.0000 | ORAL_TABLET | Freq: Every day | ORAL | Status: DC
  Administered 2018-04-22: 1 via ORAL
  Filled 2018-04-21: qty 1

## 2018-04-21 MED ORDER — DILTIAZEM HCL 25 MG/5ML IV SOLN
5.0000 mg | Freq: Once | INTRAVENOUS | Status: AC
  Administered 2018-04-21: 19:00:00 5 mg via INTRAVENOUS
  Filled 2018-04-21: qty 5

## 2018-04-21 MED ORDER — ONDANSETRON HCL 4 MG/2ML IJ SOLN: 4.0000 mg | Freq: Four times a day (QID) | INTRAMUSCULAR | Status: DC | PRN

## 2018-04-21 NOTE — Telephone Encounter (Signed)
Spoke to pts daughter who states pt recently checked her BP at [facility] and her reading was 256/98. Daughter states pt denies any ShOB, dizziness or HA, but has began to shake/tremor, which her daughter is attributing to "being a little scared." Is able to form sentences and speak with no difficulty. Advised daughter that we do not have any availability and based on her s/s she should proceed to the ED. Daughter states she will take pt to [facility]

## 2018-04-21 NOTE — ED Notes (Signed)
Pt unable to urinate at this time. Pt given urine collection cup with label and instructed a sample would be needed. Pt verbalized understanding.

## 2018-04-21 NOTE — ED Triage Notes (Signed)
Pt to ED reporting elevated BP yesterday and today. Pt denies taking BP medications but was "not feeling right" yesterday and asked her neighbor to check her BP. No dizziness, weakness, changes in vision or headache. No neuro deficits. Pt reports feeling at baseline currently but BP remains elevated.   BP in triage 215/76

## 2018-04-21 NOTE — ED Notes (Signed)
MD at bedside with pt and family.

## 2018-04-21 NOTE — ED Provider Notes (Signed)
[facility] Emergency Department Provider Note    First MD Initiated Contact with Patient 04/21/18 1412     (approximate)  I have reviewed the triage vital signs and the nursing notes.   HISTORY  Chief Complaint Hypertension    HPI Meredith Kramer is a 84 y.o. female below listed past medical history presents the ER for elevated blood pressure seen yesterday and again today.  States that she was feeling "off "past few days so she had her neighbors healthcare provider check her blood pressure and was reportedly systolic elevated to 601.  States that over the past week or 2 she is also been having intermittent chest discomforts.  Denies any shortness of breath.  Denies any headache or blurry vision.  Daughter has noted some tremor which is new.  Denies any fevers.  States that she has been taking Tylenol daily for the past week.  Denies any nausea or vomiting.  Is not currently on any antihypertensive medications.    Past Medical History:  Diagnosis Date  . Hyperlipidemia   . Osteopenia    Family History  Problem Relation Age of Onset  . Stroke Mother        age 67  . Emphysema Father        [employer]  . Coronary artery disease Father        MI age 69  . Coronary artery disease Brother        stent  . Cancer Neg Hx    Past Surgical History:  Procedure Laterality Date  . TONSILLECTOMY     Patient Active Problem List   Diagnosis Date Noted  . Chest pain 10/27/2017  . Underweight 09/23/2017  . Nasal congestion 08/06/2016  . History of melanoma 02/10/2016  . Squamous cell carcinoma of skin of left lower extremity 02/10/2016  . OAB (overactive bladder) 01/04/2016  . Counseling regarding end of life decision making 07/11/2014  . Low back strain 02/27/2013  . Varicose veins 10/27/2010  . Prediabetes 04/24/2010  . INSOMNIA, CHRONIC 01/28/2010  . Hyperlipidemia 04/28/2007  . Osteoporosis 04/28/2007      Prior to Admission medications     Medication Sig Start Date End Date Taking? Authorizing Provider  acetaminophen (TYLENOL) 500 MG tablet Take by mouth as needed     [provider]  alendronate (FOSAMAX) 70 MG tablet TAKE 1 TABLET BY MOUTH EVERY 7 DAYS TAKE ON EMPTY STOMACH WITH FULL GLASS OF WATER 02/21/18   Bedsole, Amy E, MD  Calcium Carbonate-Vitamin D (CALTRATE 600+D) 600-400 MG-UNIT per tablet Take 1 tablet by mouth daily.     [provider]  diphenhydrAMINE (SOMINEX) 25 MG tablet Take 25 mg by mouth at bedtime.     [provider]  mirabegron ER (MYRBETRIQ) 25 MG TB24 tablet Take 1 tablet (25 mg total) by mouth daily. 04/07/18   Jinny Sanders, MD    Allergies Doxycycline hyclate; Erythromycin base; and Penicillins    Social History Social History   Tobacco Use  . Smoking status: Never Smoker  . Smokeless tobacco: Never Used  Substance Use Topics  . Alcohol use: No  . Drug use: No    Review of Systems Patient denies headaches, rhinorrhea, blurry vision, numbness, shortness of breath, chest pain, edema, cough, abdominal pain, nausea, vomiting, diarrhea, dysuria, fevers, rashes or hallucinations unless otherwise stated above in HPI. ____________________________________________   PHYSICAL EXAM:  VITAL SIGNS: Vitals:   04/21/18 1426 04/21/18 1539  BP: (!) 158/124 Marland Kitchen)  183/61  Pulse:  77  Resp:  16  Temp:    SpO2:  100%    Constitutional: Alert and oriented.  Eyes: Conjunctivae are normal.  Head: Atraumatic. Nose: No congestion/rhinnorhea. Mouth/Throat: Mucous membranes are moist.   Neck: No stridor. Painless ROM.  Cardiovascular: Normal rate, regular rhythm. Grossly normal heart sounds.  Good peripheral circulation. Respiratory: Normal respiratory effort.  No retractions. Lungs CTAB. Gastrointestinal: Soft and nontender. No distention. No abdominal bruits. No CVA tenderness. Genitourinary:  Musculoskeletal: No lower extremity tenderness nor edema.  No joint  effusions. Neurologic:  Normal speech and language. No gross focal neurologic deficits are appreciated. No facial droop Skin:  Skin is warm, dry and intact. No rash noted. Psychiatric: Mood and affect are normal. Speech and behavior are normal.  ____________________________________________   LABS (all labs ordered are listed, but only abnormal results are displayed)  Results for orders placed or performed during the hospital encounter of 04/21/18 (from the past 24 hour(s))  Basic metabolic panel     Status: Abnormal   Collection Time: 04/21/18  1:09 PM  Result Value Ref Range   Sodium 131 (L) 135 - 145 mmol/L   Potassium 4.0 3.5 - 5.1 mmol/L   Chloride 94 (L) 98 - 111 mmol/L   CO2 29 22 - 32 mmol/L   Glucose, Bld 119 (H) 70 - 99 mg/dL   BUN 13 8 - 23 mg/dL   Creatinine, Ser 0.60 0.44 - 1.00 mg/dL   Calcium 9.1 8.9 - 10.3 mg/dL   GFR calc non Af Amer >60 >60 mL/min   GFR calc Af Amer >60 >60 mL/min   Anion gap 8 5 - 15  CBC     Status: None   Collection Time: 04/21/18  1:09 PM  Result Value Ref Range   WBC 6.4 4.0 - 10.5 K/uL   RBC 4.56 3.87 - 5.11 MIL/uL   Hemoglobin 13.7 12.0 - 15.0 g/dL   HCT 41.4 36.0 - 46.0 %   MCV 90.8 80.0 - 100.0 fL   MCH 30.0 26.0 - 34.0 pg   MCHC 33.1 30.0 - 36.0 g/dL   RDW 12.8 11.5 - 15.5 %   Platelets 236 150 - 400 K/uL   nRBC 0.0 0.0 - 0.2 %  Troponin I - Add-On to previous collection     Status: None   Collection Time: 04/21/18  1:09 PM  Result Value Ref Range   Troponin I <0.03 <0.03 ng/mL  Urinalysis, Complete w Microscopic     Status: Abnormal   Collection Time: 04/21/18  1:10 PM  Result Value Ref Range   Color, Urine STRAW (A) YELLOW   APPearance CLEAR (A) CLEAR   Specific Gravity, Urine 1.011 1.005 - 1.030   pH 7.0 5.0 - 8.0   Glucose, UA NEGATIVE NEGATIVE mg/dL   Hgb urine dipstick NEGATIVE NEGATIVE   Bilirubin Urine NEGATIVE NEGATIVE   Ketones, ur 5 (A) NEGATIVE mg/dL   Protein, ur 30 (A) NEGATIVE mg/dL   Nitrite NEGATIVE  NEGATIVE   Leukocytes, UA NEGATIVE NEGATIVE   RBC / HPF 0-5 0 - 5 RBC/hpf   WBC, UA 0-5 0 - 5 WBC/hpf   Bacteria, UA NONE SEEN NONE SEEN   Squamous Epithelial / LPF 0-5 0 - 5   Mucus PRESENT    ____________________________________________  EKG My review and personal interpretation at Time: 13:07   Indication: htn  Rate: 80  Rhythm: sinus Axis: normal Other: normal intervals, nonspecific st abl ____________________________________________  RADIOLOGY  I personally reviewed all radiographic images ordered to evaluate for the above acute complaints and reviewed radiology reports and findings.  These findings were personally discussed with the patient.  Please see medical record for radiology report.  ____________________________________________   PROCEDURES  Procedure(s) performed:  Procedures    Critical Care performed: no ____________________________________________   INITIAL IMPRESSION / ASSESSMENT AND PLAN / ED COURSE  Pertinent labs & imaging results that were available during my care of the patient were reviewed by me and considered in my medical decision making (see chart for details).   DDX: htnive urgency, aki, chf, ras, medication noncompliance,   Meredith Kramer is a 84 y.o. who presents to the ED with was as described above.  Patient frail and elderly-appearing.  No focal neuro deficits but does have some witnessed tremor.  Patient with markedly elevated blood pressure on my repeat exam has a systolic pressure of 174/94.  She denies any chest pain or pressure right now but states that she had had some chest pain earlier last week.  Is not currently on any antihypertensive medications.  Blood work will be sent to evaluate for the above differential.  Will give oral antihypertensive medication.   Clinical Course as of Apr 21 1552  Fri Apr 21, 2018  1532 Recheck the patient's blood pressure was actually 496 systolic with wide pulse pressure.  Family does endorse that  she is been having a little bit more trouble concentrating lately uncertain etiology but given her elevated blood pressure I am concerned that  she could be having hypertensive encephalopathy.   [PR]    Clinical Course User Index [PR] Merlyn Lot, MD  CT head does not show any evidence of mass or edema.  Chest x-ray shows no cardiomyopathy.  Renal function normal.  Does have trace proteinuria.  Based on her age and risk factors I do believe she would benefit from observation in the hospital for further medical management and hemodynamic monitoring.   As part of my medical decision making, I reviewed the following data within the [facility] notes reviewed and incorporated, Labs reviewed, notes from prior ED visits.   ____________________________________________   FINAL CLINICAL IMPRESSION(S) / ED DIAGNOSES  Final diagnoses:  Hypertensive urgency      NEW MEDICATIONS STARTED DURING THIS VISIT:  New Prescriptions   No medications on file     Note:  This document was prepared using Dragon voice recognition software and may include unintentional dictation errors.    Merlyn Lot, MD 04/21/18 [phone]

## 2018-04-21 NOTE — ED Notes (Signed)
Patient transported to CT 

## 2018-04-21 NOTE — Care Management Obs Status (Signed)
[facility] NOTIFICATION   Patient Details  Name: JHANIYA BRISKI MRN: [id number] Date of Birth: [birth year]   Medicare Observation Status Notification Given:  Yes    Shelbie Hutching, RN 04/21/2018, 5:13 PM

## 2018-04-21 NOTE — Telephone Encounter (Signed)
Agreed -

## 2018-04-21 NOTE — Care Management Note (Signed)
Case Management Note  Patient Details  Name: Meredith Kramer MRN: [id number] Date of Birth: December 20, 1929  Subjective/Objective:    Patient is being placed under observation for hypertension.  Patient is from home and lives alone.  Daughter is at the bedside and lives close by in [place].  Patient reports that she is healthy and independent, exercises every morning and requires no equipment.  Patient drives.  PCP verified as Dr. Diona Browner.  No needs identified at this time. RNCM signing off. Doran Clay RN BSN [phone]                 Action/Plan:   Expected Discharge Date:                  Expected Discharge Plan:  Home/Self Care  In-House Referral:     Discharge planning Services  CM Consult  Post Acute Care Choice:    Choice offered to:     DME Arranged:    DME Agency:     HH Arranged:    [place] Agency:     Status of Service:  Completed, signed off  If discussed at [place] of Stay Meetings, dates discussed:    Additional Comments:  Shelbie Hutching, RN 04/21/2018, 5:13 PM

## 2018-04-21 NOTE — H&P (Signed)
[facility] NAME: Meredith Kramer    MR#:  [id number]  DATE OF BIRTH:  [birth year]  DATE OF ADMISSION:  04/21/2018  PRIMARY CARE PHYSICIAN: Jinny Sanders, MD   REQUESTING/REFERRING PHYSICIAN: Dr. Merlyn Lot  CHIEF COMPLAINT:   Chief Complaint  Patient presents with  . Hypertension    HISTORY OF PRESENT ILLNESS:  Meredith Kramer  is a 84 y.o. female with a known history of hyperlipidemia, osteopenia/osteoporosis who presents to the hospital due to elevated blood pressure and dizziness and altered mental status.  Patient was in her usual state of health and was seen by her primary care physician last week and noted to have some elevated blood pressures.  She was advised to buy a blood pressure cuff and have a blood pressures checked at home.  She has no previous history of hypertension.  At home patient noticed that her systolic blood pressures were quite elevated with SBP > 180 at times.  [facility] and it was over 200 and they advised to come to the ER for further evaluation.  Patient denies any headache, nausea, vomiting, numbness, tingling or any other associated symptoms.  As per the patient's daughter patient has been more confused and foggy in nature over the past few days.  Patient was noted to have systolic blood pressures over 200 here in the ER and also given some oral medications and continues to have significantly elevated blood pressures.  Hospitalist services were contacted for admission.  PAST MEDICAL HISTORY:   Past Medical History:  Diagnosis Date  . Hyperlipidemia   . Osteopenia     PAST SURGICAL HISTORY:   Past Surgical History:  Procedure Laterality Date  . TONSILLECTOMY      SOCIAL HISTORY:   Social History   Tobacco Use  . Smoking status: Never Smoker  . Smokeless tobacco: Never Used  Substance Use Topics  . Alcohol use: No    FAMILY HISTORY:   Family  History  Problem Relation Age of Onset  . Stroke Mother        age 1  . Emphysema Father        [employer]  . Coronary artery disease Father        MI age 35  . Coronary artery disease Brother        stent  . Cancer Neg Hx     DRUG ALLERGIES:   Allergies  Allergen Reactions  . Doxycycline Hyclate     REACTION: rash  . Erythromycin Base     REACTION: rash  . Penicillins     Has patient had a PCN reaction causing immediate rash, facial/tongue/throat swelling, SOB or lightheadedness with hypotension: Unknown Has patient had a PCN reaction causing severe rash involving mucus membranes or skin necrosis: Unknown Has patient had a PCN reaction that required hospitalization: No Has patient had a PCN reaction occurring within the last 10 years: No If all of the above answers are "NO", then may proceed with Cephalosporin use.     REVIEW OF SYSTEMS:   Review of Systems  Constitutional: Negative for chills, fever and weight loss.  HENT: Negative for congestion, nosebleeds and tinnitus.   Eyes: Negative for blurred vision, double vision and redness.  Respiratory: Negative for cough, hemoptysis, shortness of breath and wheezing.   Cardiovascular: Negative for chest pain, orthopnea, leg swelling and PND.  Gastrointestinal: Negative for abdominal pain, diarrhea,  melena, nausea and vomiting.  Genitourinary: Negative for dysuria, hematuria and urgency.  Musculoskeletal: Negative for falls and joint pain.  Neurological: Negative for dizziness, tingling, sensory change, focal weakness, seizures, weakness and headaches.  Endo/Heme/Allergies: Negative for polydipsia. Does not bruise/bleed easily.  Psychiatric/Behavioral: Negative for depression and memory loss. The patient is not nervous/anxious.   All other systems reviewed and are negative.   MEDICATIONS AT HOME:   Prior to Admission medications   Medication Sig Start Date End Date Taking? Authorizing Provider  acetaminophen  (TYLENOL) 325 MG tablet Take 325 mg by mouth daily.   Yes [provider]  Calcium Carbonate-Vitamin D (CALTRATE 600+D) 600-400 MG-UNIT per tablet Take 1 tablet by mouth daily.    Yes [provider]  mirabegron ER (MYRBETRIQ) 25 MG TB24 tablet Take 1 tablet (25 mg total) by mouth daily. 04/07/18  Yes Bedsole, Amy E, MD  alendronate (FOSAMAX) 70 MG tablet TAKE 1 TABLET BY MOUTH EVERY 7 DAYS TAKE ON EMPTY STOMACH WITH FULL GLASS OF WATER 02/21/18   Bedsole, Amy E, MD  diphenhydrAMINE (SOMINEX) 25 MG tablet Take 25 mg by mouth at bedtime.     [provider]      VITAL SIGNS:  Blood pressure (!) 202/79, pulse 82, temperature 97.9 F (36.6 C), temperature source Oral, resp. rate 16, height 5\' 2"  (1.575 m), weight 47.5 kg, SpO2 100 %.  PHYSICAL EXAMINATION:  Physical Exam  GENERAL:  84 y.o.-year-old patient lying in the bed with no acute distress.  EYES: Pupils equal, round, reactive to light and accommodation. No scleral icterus. Extraocular muscles intact.  HEENT: Head atraumatic, normocephalic. Oropharynx and nasopharynx clear. No oropharyngeal erythema, moist oral mucosa  NECK:  Supple, no jugular venous distention. No thyroid enlargement, no tenderness.  LUNGS: Normal breath sounds bilaterally, no wheezing, rales, rhonchi. No use of accessory muscles of respiration.  CARDIOVASCULAR: S1, S2 RRR. No murmurs, rubs, gallops, clicks.  ABDOMEN: Soft, nontender, nondistended. Bowel sounds present. No organomegaly or mass.  EXTREMITIES: No pedal edema, cyanosis, or clubbing. + 2 pedal & radial pulses b/l.   NEUROLOGIC: Cranial nerves II through XII are intact. No focal Motor or sensory deficits appreciated b/l PSYCHIATRIC: The patient is alert and oriented x 3. Good affect.  SKIN: No obvious rash, lesion, or ulcer.   LABORATORY PANEL:   CBC Recent Labs  Lab 04/21/18 1309  WBC 6.4  HGB 13.7  HCT 41.4  PLT 236    ------------------------------------------------------------------------------------------------------------------  Chemistries  Recent Labs  Lab 04/21/18 1309  NA 131*  K 4.0  CL 94*  CO2 29  GLUCOSE 119*  BUN 13  CREATININE 0.60  CALCIUM 9.1   ------------------------------------------------------------------------------------------------------------------  Cardiac Enzymes Recent Labs  Lab 04/21/18 1309  TROPONINI <0.03   ------------------------------------------------------------------------------------------------------------------  RADIOLOGY:  Dg Chest 2 View  Result Date: 04/21/2018 CLINICAL DATA:  Hypertension. EXAM: CHEST - 2 VIEW COMPARISON:  None. FINDINGS: The cardiomediastinal silhouette is unremarkable. There is no evidence of focal airspace disease, pulmonary edema, suspicious pulmonary nodule/mass, pleural effusion, or pneumothorax. No acute bony abnormalities are identified. IMPRESSION: No active cardiopulmonary disease. Electronically Signed   By: Margarette Canada M.D.   On: 04/21/2018 14:54   Ct Head Wo Contrast  Result Date: 04/21/2018 CLINICAL DATA:  Hypertension. EXAM: CT HEAD WITHOUT CONTRAST TECHNIQUE: Contiguous axial images were obtained from the base of the skull through the vertex without intravenous contrast. COMPARISON:  None. FINDINGS: Brain: Mild chronic ischemic white matter disease is noted. No mass effect or midline  shift is noted. Ventricular size is within normal limits. There is no evidence of mass lesion, hemorrhage or acute infarction. Vascular: No hyperdense vessel or unexpected calcification. Skull: Normal. Negative for fracture or focal lesion. Sinuses/Orbits: No acute finding. Other: None. IMPRESSION: Mild chronic ischemic white matter disease. No acute intracranial abnormality seen. Electronically Signed   By: Marijo Conception, M.D.   On: 04/21/2018 15:05     IMPRESSION AND PLAN:   84 year old female with past medical history of  osteoporosis, hyperlipidemia and no previous history of high blood pressure who presents to the hospital due to confusion, altered mental status and noted to have a significantly elevated blood pressures.  1.  Altered mental status/confusion-secondary to mild hypertensive encephalopathy.   -patient has had systolic blood pressures greater than 180-200.  CT head is negative for acute pathology. - We will treat patient's hypertension with oral meds and also IV antihypertensives and follow mental status.  2.  Accelerated hypertension/hypertensive urgency- patient has no previous history of hypertension. - We will place the patient on oral lisinopril, hydrochlorothiazide.  We will also add some IV labetalol and hydralazine as needed. - Follow hemodynamics.  3.  History of urinary incontinence-continue Myrbetriq.  4.  Osteoporosis/osteopenia-continue calcium and vitamin D supplements.    All the records are reviewed and case discussed with ED provider. Management plans discussed with the patient, family and they are in agreement.  CODE STATUS: Full code  TOTAL TIME TAKING CARE OF THIS PATIENT: 40 minutes.    Henreitta Leber M.D on 04/21/2018 at 4:41 PM  Between 7am to 6pm - Pager - [phone]  After 6pm go to [email or web address] - password [email or web address] [facility]  Office  [phone]  CC: Primary care physician; Jinny Sanders, MD

## 2018-04-22 DIAGNOSIS — I160 Hypertensive urgency: Secondary | ICD-10-CM | POA: Diagnosis not present

## 2018-04-22 DIAGNOSIS — M810 Age-related osteoporosis without current pathological fracture: Secondary | ICD-10-CM | POA: Diagnosis not present

## 2018-04-22 DIAGNOSIS — R4182 Altered mental status, unspecified: Secondary | ICD-10-CM | POA: Diagnosis not present

## 2018-04-22 MED ORDER — LISINOPRIL 10 MG PO TABS: 10.0000 mg | ORAL_TABLET | Freq: Every day | ORAL | 0 refills | Status: DC

## 2018-04-22 MED ORDER — HYDROCHLOROTHIAZIDE 25 MG PO TABS: 25.0000 mg | ORAL_TABLET | Freq: Every day | ORAL | 0 refills | Status: DC

## 2018-04-22 NOTE — Progress Notes (Signed)
Patient denies any complaints, blood pressure improved, this morning at 129/63 and heart rate 71.  Discharge her home with lisinopril 10 mg daily, HCTZ 25 mg daily,    Patient told me that her blood pressure has been running little high and PCP advised her to her by a BP machine.  Yesterday she was feeling dizzy and [facility] her blood pressure systolic more than 026 advised her to go to hospital so she came.  Hypertensive urgency resolved.

## 2018-04-22 NOTE — Progress Notes (Signed)
Pt D/C to home by daughter. Education completed. IV removed intact. VSS. Education completed. All belongings taken with pt.

## 2018-04-24 ENCOUNTER — Telehealth: Payer: Self-pay | Admitting: *Deleted

## 2018-04-24 NOTE — Discharge Summary (Signed)
Meredith Kramer, is a 84 y.o. female  DOB 1929-08-03  MRN [id number].  Admission date:  04/21/2018  Admitting Physician  Henreitta Leber, MD  Discharge Date:  04/22/2018   Primary MD  Jinny Sanders, MD  Recommendations for primary care physician for things to follow:   Follow-up with PCP in 1 week   Admission Diagnosis  Hypertensive urgency [I16.0]   Discharge Diagnosis  Hypertensive urgency [I16.0]    Active Problems:   Hypertensive urgency      Past Medical History:  Diagnosis Date  . Hyperlipidemia   . Osteopenia     Past Surgical History:  Procedure Laterality Date  . TONSILLECTOMY         History of present illness and  Hospital Course:     Kindly see H&P for history of present illness and admission details, please review complete Labs, Consult reports and Test reports for all details in brief  HPI  from the history and physical done on the day of admission 84 year old female patient with history of hyperlipidemia, osteoporosis [facility] because she was feeling dizzy and noted to have BP more than 518 systolics.  Came to ER.  Admitted for altered mental status with confusion secondary to hypertensive encephalopathy.  Hospital Course  #1 ;altered mental status with confusion secondary to mild hypertensive encephalopathy: Patient CT head did not show acute changes.  Patient started on lisinopril, HCTZ., blood pressure improved with these 2 new medicines, patient blood work otherwise unremarkable.  Patient felt much better and wanted to go home, discharged home with lisinopril 10 mg daily, HCTZ 25 mg p.o. daily, advised to follow-up with PCP in 1 week, patient told me that her PCP mentioned that her blood pressure is running little bit high and have blood pressure machine at home.   Patient does have a BP machine at home but decided to [facility].  Advised her to keep a log of her blood pressure before so that she can get titration of her blood pressure medicines.     Discharge Condition: Stable   Follow UP  Follow-up Information    Bedsole, Amy E, MD. Schedule an appointment as soon as possible for a visit in 1 week(s).   Specialty:  Family Medicine Why:  advised the patient to call PCP and make appointment and follow-up with her next week. Contact information: [place] [address] [phone]             Discharge Instructions  and  Discharge Medications      Allergies as of 04/22/2018      Reactions   Doxycycline Hyclate    REACTION: rash   Erythromycin Base    REACTION: rash   Penicillins    Has patient had a PCN reaction causing immediate rash, facial/tongue/throat swelling, SOB or lightheadedness with hypotension: Unknown Has patient had a PCN reaction causing severe rash involving mucus membranes or skin necrosis: Unknown Has patient had a PCN reaction that required hospitalization: No Has patient had a PCN reaction occurring within the last 10 years: No If all of the above answers are "NO", then may proceed with Cephalosporin use.      Medication List    STOP taking these medications   acetaminophen 325 MG tablet Commonly known as:  TYLENOL     TAKE these medications   alendronate 70 MG tablet Commonly known as:  FOSAMAX  TAKE 1 TABLET BY MOUTH EVERY 7 DAYS TAKE ON EMPTY STOMACH WITH FULL GLASS OF WATER   CALTRATE 600+D 600-400 MG-UNIT tablet Generic drug:  Calcium Carbonate-Vitamin D Take 1 tablet by mouth daily.   diphenhydrAMINE 25 MG tablet Commonly known as:  SOMINEX Take 25 mg by mouth at bedtime.   hydrochlorothiazide 25 MG tablet Commonly known as:  HYDRODIURIL Take 1 tablet (25 mg total) by mouth daily.   lisinopril 10 MG tablet Commonly known as:   PRINIVIL,ZESTRIL Take 1 tablet (10 mg total) by mouth daily.   mirabegron ER 25 MG Tb24 tablet Commonly known as:  MYRBETRIQ Take 1 tablet (25 mg total) by mouth daily.         Diet and Activity recommendation: See Discharge Instructions above   Consults obtained -none   Major procedures and Radiology Reports - PLEASE review detailed and final reports for all details, in brief -      Dg Chest 2 View  Result Date: 04/21/2018 CLINICAL DATA:  Hypertension. EXAM: CHEST - 2 VIEW COMPARISON:  None. FINDINGS: The cardiomediastinal silhouette is unremarkable. There is no evidence of focal airspace disease, pulmonary edema, suspicious pulmonary nodule/mass, pleural effusion, or pneumothorax. No acute bony abnormalities are identified. IMPRESSION: No active cardiopulmonary disease. Electronically Signed   By: Margarette Canada M.D.   On: 04/21/2018 14:54   Ct Head Wo Contrast  Result Date: 04/21/2018 CLINICAL DATA:  Hypertension. EXAM: CT HEAD WITHOUT CONTRAST TECHNIQUE: Contiguous axial images were obtained from the base of the skull through the vertex without intravenous contrast. COMPARISON:  None. FINDINGS: Brain: Mild chronic ischemic white matter disease is noted. No mass effect or midline shift is noted. Ventricular size is within normal limits. There is no evidence of mass lesion, hemorrhage or acute infarction. Vascular: No hyperdense vessel or unexpected calcification. Skull: Normal. Negative for fracture or focal lesion. Sinuses/Orbits: No acute finding. Other: None. IMPRESSION: Mild chronic ischemic white matter disease. No acute intracranial abnormality seen. Electronically Signed   By: Marijo Conception, M.D.   On: 04/21/2018 15:05    Micro Results     No results found for this or any previous visit (from the past 240 hour(s)).     Today   Subjective:   Meredith Kramer today has no headache,no chest abdominal pain,no new weakness tingling or numbness, feels much better wants  to go home today.   Objective:   Blood pressure 129/63, pulse 72, temperature 98 F (36.7 C), resp. rate 17, height 5\' 2"  (1.575 m), weight 47.5 kg, SpO2 97 %.  No intake or output data in the 24 hours ending 04/24/18 1114  Exam Awake Alert, Oriented x 3, No new F.N deficits, Normal affect [place].AT,PERRAL Supple Neck,No JVD, No cervical lymphadenopathy appriciated.  Symmetrical Chest wall movement, Good air movement bilaterally, CTAB RRR,No Gallops,Rubs or new Murmurs, No Parasternal Heave +ve B.Sounds, Abd Soft, Non tender, No organomegaly appriciated, No rebound -guarding or rigidity. No Cyanosis, Clubbing or edema, No new Rash or bruise  Data Review   CBC w Diff:  Lab Results  Component Value Date   WBC 6.4 04/21/2018   HGB 13.7 04/21/2018   HCT 41.4 04/21/2018   PLT 236 04/21/2018    CMP:  Lab Results  Component Value Date   NA 131 (L) 04/21/2018   K 4.0 04/21/2018   CL 94 (L) 04/21/2018   CO2 29 04/21/2018   BUN 13 04/21/2018   CREATININE 0.60 04/21/2018   PROT 6.8 09/19/2017  ALBUMIN 4.4 09/19/2017   BILITOT 0.5 09/19/2017   ALKPHOS 34 (L) 09/19/2017   AST 19 09/19/2017   ALT 14 09/19/2017  .   Total Time in preparing paper work, data evaluation and todays exam - 35 minutes  Epifanio Lesches M.D on 04/22/2018 at 11:14 AM    Note: This dictation was prepared with Dragon dictation along with smaller phrase technology. Any transcriptional errors that result from this process are unintentional.

## 2018-04-24 NOTE — Telephone Encounter (Signed)
Transition Care Management Follow-up Telephone Call   Date discharged? 04/22/18   How have you been since you were released from the hospital? "Doing good"   Do you understand why you were in the hospital? Yes   Do you understand the discharge instructions? Yes and daughter is helping   Where were you discharged to? Home   Items Reviewed:  Medications reviewed: YES  Allergies reviewed: YES  Dietary changes reviewed: YES  Referrals reviewed: YES   Functional Questionnaire:   Activities of Daily Living (ADLs):   She states they are independent in the following: grooming, going to the bathroom, dressing, ambulation, bathing and feeding  States they require assistance with the following: Nothing   Any transportation issues/concerns?: NO   Any patient concerns? NO   Confirmed importance and date/time of follow-up visits scheduled YES    Provider Appointment booked with Dr. Diona Browner 04/27/18 at 11:15  Confirmed with patient if condition begins to worsen call PCP or go to the ER.  Patient was given the office number and encouraged to call back with question or concerns.  : YES

## 2018-04-27 ENCOUNTER — Encounter: Payer: Self-pay | Admitting: Family Medicine

## 2018-04-27 ENCOUNTER — Ambulatory Visit (INDEPENDENT_AMBULATORY_CARE_PROVIDER_SITE_OTHER): Payer: PPO | Admitting: Family Medicine

## 2018-04-27 VITALS — BP 150/60 | HR 79 | Temp 97.9°F | Ht 62.0 in | Wt 104.0 lb

## 2018-04-27 DIAGNOSIS — I160 Hypertensive urgency: Secondary | ICD-10-CM | POA: Diagnosis not present

## 2018-04-27 DIAGNOSIS — R413 Other amnesia: Secondary | ICD-10-CM | POA: Diagnosis not present

## 2018-04-27 LAB — T4, FREE: Free T4: 0.89 ng/dL (ref 0.60–1.60)

## 2018-04-27 LAB — COMPREHENSIVE METABOLIC PANEL
ALBUMIN: 4.5 g/dL (ref 3.5–5.2)
ALT: 15 U/L (ref 0–35)
AST: 17 U/L (ref 0–37)
Alkaline Phosphatase: 32 U/L — ABNORMAL LOW (ref 39–117)
BUN: 15 mg/dL (ref 6–23)
CO2: 33 mEq/L — ABNORMAL HIGH (ref 19–32)
Calcium: 9.4 mg/dL (ref 8.4–10.5)
Chloride: 89 mEq/L — ABNORMAL LOW (ref 96–112)
Creatinine, Ser: 0.64 mg/dL (ref 0.40–1.20)
GFR: 93.5 mL/min (ref >60.00)
Glucose, Bld: 124 mg/dL — ABNORMAL HIGH (ref 70–99)
Potassium: 4.9 mEq/L (ref 3.5–5.1)
Sodium: 127 mEq/L — ABNORMAL LOW (ref 135–145)
Total Bilirubin: 0.5 mg/dL (ref 0.2–1.2)
Total Protein: 6.9 g/dL (ref 6.0–8.3)

## 2018-04-27 LAB — VITAMIN B12: Vitamin B-12: 893 pg/mL (ref 211–911)

## 2018-04-27 LAB — TSH: TSH: 1.45 u[IU]/mL (ref 0.35–4.50)

## 2018-04-27 LAB — VITAMIN D 25 HYDROXY (VIT D DEFICIENCY, FRACTURES): VITD: 25.73 ng/mL — ABNORMAL LOW (ref 30.00–100.00)

## 2018-04-27 LAB — T3, FREE: T3, Free: 3.3 pg/mL (ref 2.3–4.2)

## 2018-04-27 MED ORDER — LISINOPRIL 20 MG PO TABS: 20.0000 mg | ORAL_TABLET | Freq: Every day | ORAL | 3 refills | Status: DC

## 2018-04-27 MED ORDER — HYDROCHLOROTHIAZIDE 25 MG PO TABS: 25.0000 mg | ORAL_TABLET | Freq: Every day | ORAL | 11 refills | Status: DC

## 2018-04-27 MED ORDER — LISINOPRIL 10 MG PO TABS: 10.0000 mg | ORAL_TABLET | Freq: Every day | ORAL | 11 refills | Status: DC

## 2018-04-27 NOTE — Progress Notes (Signed)
Subjective:    Patient ID: Meredith Kramer, female    DOB: 01-Aug-1929, 84 y.o.   MRN: [id number]  HPI  84 year old female presents for hospital follow up.  Admitted 12/6-12/7 for hypertensive urgency 256/98   Hospital Course  Altered mental status with confusion secondary to mild hypertensive encephalopathy: Patient CT head did not show acute changes.  Patient started on lisinopril, HCTZ., blood pressure improved with these 2 new medicines, patient blood work otherwise unremarkable.  Patient felt much better and wanted to go home, discharged home with lisinopril 10 mg daily, HCTZ 25 mg p.o. daily, advised to follow-up with PCP in 1 week,  Advised her to keep a log of her blood pressure before so that she can get titration of her blood pressure medicines.  She was not having any headache.  Was having Chest pain saw  Dr. Silvio Pate 11/6.  Today:  Hypertension:   Now on lisinopril HCTZ   BP Readings from Last 3 Encounters:  04/27/18 (!) 150/60  04/22/18 129/63  04/07/18 (!) 164/64  Using medication without problems or lightheadedness: none Chest pain with exertion: none lately Edema:none Short of breath:none Average home BPs: 170/80 Other issues:   Her daughter has noted increased in confusion overall in last years.. worse recently.  Forgetting things/ medication.     When she was in hospital daughter  Noted episode of increased heart rate..  Told posisble afib... not in discharge summary.   Review of Systems  Constitutional: Positive for fatigue. Negative for fever.  HENT: Negative for congestion.   Eyes: Negative for pain.  Respiratory: Negative for cough and shortness of breath.   Cardiovascular: Negative for chest pain, palpitations and leg swelling.  Gastrointestinal: Negative for abdominal pain.  Genitourinary: Negative for dysuria and vaginal bleeding.  Musculoskeletal: Negative for back pain.  Neurological: Negative for syncope, light-headedness and headaches.    Psychiatric/Behavioral: Positive for confusion. Negative for dysphoric mood.       Objective:   Physical Exam Constitutional:      General: She is not in acute distress.    Appearance: Normal appearance. She is well-developed. She is not ill-appearing or toxic-appearing.  HENT:     Head: Normocephalic.     Right Ear: Hearing, tympanic membrane, ear canal and external ear normal. Tympanic membrane is not erythematous, retracted or bulging.     Left Ear: Hearing, tympanic membrane, ear canal and external ear normal. Tympanic membrane is not erythematous, retracted or bulging.     Nose: No mucosal edema or rhinorrhea.     Right Sinus: No maxillary sinus tenderness or frontal sinus tenderness.     Left Sinus: No maxillary sinus tenderness or frontal sinus tenderness.     Mouth/Throat:     Pharynx: Uvula midline.  Eyes:     General: Lids are normal. Lids are everted, no foreign bodies appreciated.     Conjunctiva/sclera: Conjunctivae normal.     Pupils: Pupils are equal, round, and reactive to light.  Neck:     Musculoskeletal: Normal range of motion and neck supple.     Thyroid: No thyroid mass or thyromegaly.     Vascular: No carotid bruit.     Trachea: Trachea normal.  Cardiovascular:     Rate and Rhythm: Normal rate and regular rhythm.     Pulses: Normal pulses.     Heart sounds: Normal heart sounds, S1 normal and S2 normal. No murmur. No friction rub. No gallop.   Pulmonary:  Effort: Pulmonary effort is normal. No tachypnea or respiratory distress.     Breath sounds: Normal breath sounds. No decreased breath sounds, wheezing, rhonchi or rales.  Abdominal:     General: Bowel sounds are normal.     Palpations: Abdomen is soft.     Tenderness: There is no abdominal tenderness.  Skin:    General: Skin is warm and dry.     Findings: No rash.  Neurological:     Mental Status: She is alert and oriented to person, place, and time.  Psychiatric:        Mood and Affect: Mood is  not anxious or depressed.        Speech: Speech normal.        Behavior: Behavior normal. Behavior is cooperative.        Thought Content: Thought content normal.        Cognition and Memory: She exhibits impaired recent memory.        Judgment: Judgment normal.           Assessment & Plan:

## 2018-04-27 NOTE — Patient Instructions (Signed)
Please stop at the lab to have labs drawn. Please stop at the front desk to set up referral.  

## 2018-05-01 NOTE — Progress Notes (Signed)
Cardiology Office Note   Date:  05/02/2018   ID:  Meredith Kramer, DOB [birth year], MRN [id number]  PCP:  Jinny Sanders, MD  Cardiologist:   No primary care provider on file. Referring:  Jinny Sanders, MD  Chief Complaint  Patient presents with  . Hypertension      History of Present Illness: Meredith Kramer is a 84 y.o. female who is referred by Jinny Sanders, MD for evaluation of hypertensive urgency.  She was in the hospital earlier this month for this.  She was not having any symptoms but had her blood pressure taken [facility] and it was reported 300 systolic.  She then presented to the emergency room where her blood pressure was recorded at 215/76 at its peak.  She had increased confusion perhaps slightly but this was not the main issue.  There were no clear symptoms.  She was not having any chest pressure, neck or arm discomfort.  She does not describe any shortness of breath, PND or orthopnea.  She was sent home after a day of observation hydrochlorothiazide and lisinopril.  However, she had hyponatremia and her hydrochlorothiazide was decreased and lisinopril increased since she was seen.  Of note her daughter who is an EMT mentions that her heart rate was 186 all night long.  I do not see any mention of this by the providers in the hospital but I did find strips demonstrating clearly atrial fibrillation paroxysmally.  She does not feel palpitations.  She has not had any presyncope or syncope.  This has never been noted before.  Of note the patient lives by herself.  She takes care of her chores of daily living.   Past Medical History:  Diagnosis Date  . Hyperlipidemia   . Osteopenia     Past Surgical History:  Procedure Laterality Date  . TONSILLECTOMY       Current Outpatient Medications  Medication Sig Dispense Refill  . alendronate (FOSAMAX) 70 MG tablet TAKE 1 TABLET BY MOUTH EVERY 7 DAYS TAKE ON EMPTY STOMACH WITH FULL GLASS OF WATER 12 tablet 1  . Calcium  Carbonate-Vitamin D (CALTRATE 600+D) 600-400 MG-UNIT per tablet Take 1 tablet by mouth daily.     . diphenhydrAMINE (SOMINEX) 25 MG tablet Take 25 mg by mouth at bedtime.     Marland Kitchen lisinopril (PRINIVIL,ZESTRIL) 20 MG tablet Take 1 tablet (20 mg total) by mouth daily. 90 tablet 3  . mirabegron ER (MYRBETRIQ) 25 MG TB24 tablet Take 1 tablet (25 mg total) by mouth daily. 30 tablet 5  . apixaban (ELIQUIS) 2.5 MG TABS tablet Take 1 tablet (2.5 mg total) by mouth 2 (two) times daily. 60 tablet 11  . hydrALAZINE (APRESOLINE) 10 MG tablet Take 1 tablet daily for systolic blood pressure >762 30 tablet 5   No current facility-administered medications for this visit.     Allergies:   Doxycycline hyclate; Erythromycin base; and Penicillins    Social History:  The patient  reports that she has never smoked. She has never used smokeless tobacco. She reports that she does not drink alcohol or use drugs.   Family History:  The patient's family history includes Coronary artery disease in her brother and father; Emphysema in her father; Stroke in her mother.    ROS:  Please see the history of present illness.   Otherwise, review of systems are positive for decreased memory.   All other systems are reviewed and negative.    PHYSICAL EXAM:  VS:  BP (!) 158/60 (BP Location: Right Arm, Patient Position: Sitting, Cuff Size: Normal)   Pulse 62   Ht 5\' 2"  (1.575 m)   Wt 105 lb (47.6 kg)   BMI 19.20 kg/m  , BMI Body mass index is 19.2 kg/m. GENERAL:  Well appearing, she looks good for her stated age 89:  Pupils equal round and reactive, fundi not visualized, oral mucosa unremarkable NECK:  No jugular venous distention, waveform within normal limits, carotid upstroke brisk and symmetric, no bruits, no thyromegaly LYMPHATICS:  No cervical, inguinal adenopathy LUNGS:  Clear to auscultation bilaterally BACK:  No CVA tenderness CHEST:  Unremarkable HEART:  PMI not displaced or sustained,S1 and S2 within normal  limits, no S3, no S4, no clicks, no rubs, no murmurs ABD:  Flat, positive bowel sounds normal in frequency in pitch, no bruits, no rebound, no guarding, no midline pulsatile mass, no hepatomegaly, no splenomegaly EXT:  2 plus pulses throughout, no edema, no cyanosis no clubbing SKIN:  No rashes no nodules NEURO:  Cranial nerves II through XII grossly intact, motor grossly intact throughout PSYCH:  Cognitively intact, oriented to person place and time    EKG:  EKG is not ordered today. The ekg ordered 04/21/18 demonstrates sinus rhythm, rate 79, axis within normal limits, intervals within normal limits, no acute ST-T wave changes.   Recent Labs: 04/21/2018: Hemoglobin 13.7; Platelets 236 04/27/2018: ALT 15; BUN 15; Creatinine, Ser 0.64; Potassium 4.9; Sodium 127; TSH 1.45    Lipid Panel    Component Value Date/Time   CHOL 198 09/19/2017 1353   TRIG 67.0 09/19/2017 1353   HDL 75.10 09/19/2017 1353   CHOLHDL 3 09/19/2017 1353   VLDL 13.4 09/19/2017 1353   LDLCALC 110 (H) 09/19/2017 1353      Wt Readings from Last 3 Encounters:  05/02/18 105 lb (47.6 kg)  04/27/18 104 lb (47.2 kg)  04/21/18 104 lb 12 oz (47.5 kg)      Other studies Reviewed: Additional studies/ records that were reviewed today include: Hospital records. Review of the above records demonstrates:  Please see elsewhere in the note.     ASSESSMENT AND PLAN:  HTN:   The patient's had blood pressure spikes as reported above.  She is not having symptoms related to this.  They are going to keep the blood pressure 4 times a day or so and bring me a diary.  Further adjustments will be based on this.  At this point her TSH and electrolytes are normal and I am not going to look for other secondary causes.  ATRIAL FIB: This is a new finding.  I did verify that this happened in the hospital.  I am to have her wear a 48-hour Holter to see what the burden of this might be.  Meredith Kramer has a CHA2DS2 - VASc score of  4.  Therefore, anticoagulation is indicated.  She be a candidate for the lower dose of Eliquis.  She has no bleeding contraindications.  We went over the risks benefits of this.   Current medicines are reviewed at length with the patient today.  The patient does not have concerns regarding medicines.  The following changes have been made:  no change  Labs/ tests ordered today include:   Orders Placed This Encounter  Procedures  . Holter monitor - 48 hour     Disposition:   FU with me in one month.Ronnell Guadalajara, MD  05/02/2018 4:18 PM  [facility]

## 2018-05-02 ENCOUNTER — Encounter: Payer: Self-pay | Admitting: Cardiology

## 2018-05-02 ENCOUNTER — Ambulatory Visit: Payer: PPO | Admitting: Cardiology

## 2018-05-02 VITALS — BP 158/60 | HR 62 | Ht 62.0 in | Wt 105.0 lb

## 2018-05-02 DIAGNOSIS — I10 Essential (primary) hypertension: Secondary | ICD-10-CM | POA: Diagnosis not present

## 2018-05-02 DIAGNOSIS — I480 Paroxysmal atrial fibrillation: Secondary | ICD-10-CM | POA: Diagnosis not present

## 2018-05-02 MED ORDER — HYDRALAZINE HCL 10 MG PO TABS: ORAL_TABLET | ORAL | 5 refills | Status: AC

## 2018-05-02 MED ORDER — APIXABAN 2.5 MG PO TABS: 2.5000 mg | ORAL_TABLET | Freq: Two times a day (BID) | ORAL | 11 refills | Status: DC

## 2018-05-02 NOTE — Telephone Encounter (Signed)
Patient seen by Dr. Diona Browner on 04/27/18.

## 2018-05-02 NOTE — Patient Instructions (Addendum)
Medication Instructions:  START Eliquis 2.5mg  twice daily. An Rx has been sent to your pharmacy START Hydralazine 10mg  daily as needed for sysolic BP >456  If you need a refill on your cardiac medications before your next appointment, please call your pharmacy.   Lab work: None ordered If you have labs (blood work) drawn today and your tests are completely normal, you will receive your results only by: Marland Kitchen MyChart Message (if you have MyChart) OR . A paper copy in the mail If you have any lab test that is abnormal or we need to change your treatment, we will call you to review the results.  Testing/Procedures: Your physician has recommended that you wear a holter monitor. Holter monitors are medical devices that record the heart's electrical activity. Doctors most often use these monitors to diagnose arrhythmias. Arrhythmias are problems with the speed or rhythm of the heartbeat. The monitor is a small, portable device. You can wear one while you do your normal daily activities. This is usually used to diagnose what is causing palpitations/syncope (passing out).    Follow-Up: At [facility], you and your health needs are our priority.  As part of our continuing mission to provide you with exceptional heart care, we have created designated Provider Care Teams.  These Care Teams include your primary Cardiologist (physician) and Advanced Practice Providers (APPs -  Physician Assistants and Nurse Practitioners) who all work together to provide you with the care you need, when you need it. You will need a follow up appointment in 4 weeks.   You may see Dr.Hochrein or one of the following Advanced Practice Providers on your designated Care Team:   Rosaria Ferries, PA-C . Jory Sims, DNP, ANP  Any Other Special Instructions Will Be Listed Below (If Applicable). Your physician has requested that you regularly monitor and record your blood pressure readings at home. Please use the same machine  at the same time of day to check your readings and record them to bring to your follow-up visit.

## 2018-05-04 DIAGNOSIS — H906 Mixed conductive and sensorineural hearing loss, bilateral: Secondary | ICD-10-CM | POA: Diagnosis not present

## 2018-05-09 DIAGNOSIS — I152 Hypertension secondary to endocrine disorders: Secondary | ICD-10-CM | POA: Insufficient documentation

## 2018-05-09 DIAGNOSIS — E1159 Type 2 diabetes mellitus with other circulatory complications: Secondary | ICD-10-CM | POA: Insufficient documentation

## 2018-05-09 DIAGNOSIS — H906 Mixed conductive and sensorineural hearing loss, bilateral: Secondary | ICD-10-CM | POA: Diagnosis not present

## 2018-05-09 DIAGNOSIS — I10 Essential (primary) hypertension: Secondary | ICD-10-CM | POA: Insufficient documentation

## 2018-05-09 NOTE — Assessment & Plan Note (Signed)
Get  Blood pressure cuff .Marland Kitchen Follow BP at home daily.. Call in 1-2 weeks with measurements.   Work on low salt diet.  pt asymptomatic.

## 2018-05-09 NOTE — Assessment & Plan Note (Signed)
Improved with mybetriq.

## 2018-05-18 ENCOUNTER — Ambulatory Visit (INDEPENDENT_AMBULATORY_CARE_PROVIDER_SITE_OTHER): Payer: PPO

## 2018-05-18 DIAGNOSIS — I480 Paroxysmal atrial fibrillation: Secondary | ICD-10-CM

## 2018-05-31 NOTE — Progress Notes (Signed)
Cardiology Office Note   Date:  06/01/2018   ID:  [name], DOB 1929-07-28, MRN [id number]  PCP:  Meredith Sanders, MD  Cardiologist:   No primary care provider on file. Referring:  Meredith Sanders, MD  Chief Complaint  Patient presents with  . Hypertension  . Atrial Fibrillation      History of Present Illness: Meredith Kramer is a 85 y.o. female who is referred by Meredith Sanders, MD for evaluation of hypertensive urgency.  She was in the hospital last month for this.  She was not having any symptoms but [facility] and it was reported 403 systolic.  She then presented to the emergency room where her blood pressure was recorded at 215/76 at its peak.  She had atrial fib noted at time of the admission.  She was started on Eliquis.   She had a Holter.   This demonstrated some SVT but no atrial fibrillation.  She is had increasing confusion per her daughter.  She denies any cardiovascular symptoms however.  She has not had any palpitations, presyncope or syncope.  She has had no chest pain.  She kept the blood pressure diary.  She has not had any of the accelerated pressures that she was having.  She has not had to take any as needed hydralazine.  However, her systolics have been 474Q to 180s over 70s to 80s.  She denies any chest pressure, neck or arm discomfort.  She is had no new shortness of breath, PND or orthopnea.  She said no palpitations, presyncope or syncope.  Has had no weight gain or edema.  Past Medical History:  Diagnosis Date  . Hyperlipidemia   . Osteopenia     Past Surgical History:  Procedure Laterality Date  . TONSILLECTOMY       Current Outpatient Medications  Medication Sig Dispense Refill  . alendronate (FOSAMAX) 70 MG tablet TAKE 1 TABLET BY MOUTH EVERY 7 DAYS TAKE ON EMPTY STOMACH WITH FULL GLASS OF WATER 12 tablet 1  . apixaban (ELIQUIS) 2.5 MG TABS tablet Take 1 tablet (2.5 mg total) by mouth 2 (two) times daily. 60 tablet  11  . Calcium Carbonate-Vitamin D (CALTRATE 600+D) 600-400 MG-UNIT per tablet Take 1 tablet by mouth daily.     . diphenhydrAMINE (SOMINEX) 25 MG tablet Take 25 mg by mouth at bedtime.     . hydrALAZINE (APRESOLINE) 10 MG tablet Take 1 tablet daily for systolic blood pressure >595 30 tablet 5  . lisinopril (PRINIVIL,ZESTRIL) 20 MG tablet Take 1 tablet (20 mg total) by mouth daily. 90 tablet 3  . mirabegron ER (MYRBETRIQ) 25 MG TB24 tablet Take 1 tablet (25 mg total) by mouth daily. 30 tablet 5  . amLODipine (NORVASC) 2.5 MG tablet Take 1 tablet (2.5 mg total) by mouth daily. 90 tablet 3   No current facility-administered medications for this visit.     Allergies:   Doxycycline hyclate; Erythromycin base; and Penicillins    ROS:  Please see the history of present illness.   Otherwise, review of systems are positive for none.   All other systems are reviewed and negative.    PHYSICAL EXAM: VS:  BP (!) 162/62   Pulse 79   Ht 5\' 2"  (1.575 m)   Wt 104 lb 3.2 oz (47.3 kg)   BMI 19.06 kg/m  , BMI Body mass index is 19.06 kg/m. GENERAL:  Well appearing NECK:  No jugular  venous distention, waveform within normal limits, carotid upstroke brisk and symmetric, no bruits, no thyromegaly LUNGS:  Clear to auscultation bilaterally CHEST:  Unremarkable HEART:  PMI not displaced or sustained,S1 and S2 within normal limits, no S3, no S4, no clicks, no rubs, no murmurs ABD:  Flat, positive bowel sounds normal in frequency in pitch, no bruits, no rebound, no guarding, no midline pulsatile mass, no hepatomegaly, no splenomegaly EXT:  2 plus pulses throughout, no edema, no cyanosis no clubbing    EKG:  EKG is not  ordered today.   Recent Labs: 04/21/2018: Hemoglobin 13.7; Platelets 236 04/27/2018: ALT 15; BUN 15; Creatinine, Ser 0.64; Potassium 4.9; Sodium 127; TSH 1.45    Lipid Panel    Component Value Date/Time   CHOL 198 09/19/2017 1353   TRIG 67.0 09/19/2017 1353   HDL 75.10 09/19/2017  1353   CHOLHDL 3 09/19/2017 1353   VLDL 13.4 09/19/2017 1353   LDLCALC 110 (H) 09/19/2017 1353      Wt Readings from Last 3 Encounters:  06/01/18 104 lb 3.2 oz (47.3 kg)  05/02/18 105 lb (47.6 kg)  04/27/18 104 lb (47.2 kg)      Other studies Reviewed: Additional studies/ records that were reviewed today include: None Review of the above records demonstrates:     ASSESSMENT AND PLAN:  HTN:   Her blood pressure is not at target.  I am can add Norvasc 2.5 mg daily.  We will have her follow-up in our Hypertension Clinic.  She is going to talk to her prescribing physician about stopping mirabegron which can cause hypertension.  ATRIAL FIB:  Ms. Meredith Kramer has a CHA2DS2 - VASc score of 4.  She is had no symptomatic or documented recurrence of this but she is at risk and so we will continue on anticoagulation.  She has no problems taking this.  She is on the appropriate dose.    Current medicines are reviewed at length with the patient today.  The patient does not have concerns regarding medicines.  The following changes have been made:  As above  Labs/ tests ordered today include: None  No orders of the defined types were placed in this encounter.    Disposition:   FU with me in HTN Clinic   Signed, Minus Breeding, MD  06/01/2018 12:39 PM    [place]

## 2018-06-01 ENCOUNTER — Ambulatory Visit (INDEPENDENT_AMBULATORY_CARE_PROVIDER_SITE_OTHER): Payer: PPO | Admitting: Cardiology

## 2018-06-01 ENCOUNTER — Encounter: Payer: Self-pay | Admitting: Cardiology

## 2018-06-01 VITALS — BP 162/62 | HR 79 | Ht 62.0 in | Wt 104.2 lb

## 2018-06-01 DIAGNOSIS — I10 Essential (primary) hypertension: Secondary | ICD-10-CM

## 2018-06-01 DIAGNOSIS — I480 Paroxysmal atrial fibrillation: Secondary | ICD-10-CM

## 2018-06-01 MED ORDER — AMLODIPINE BESYLATE 2.5 MG PO TABS: 2.5000 mg | ORAL_TABLET | Freq: Every day | ORAL | 3 refills | Status: DC

## 2018-06-01 NOTE — Patient Instructions (Signed)
Medication Instructions:  START- Amlodipine 2.5 mg daily  If you need a refill on your cardiac medications before your next appointment, please call your pharmacy.  Labwork: None Ordered   Take the provided lab slips with you to the lab for your blood draw.  When you have your labs (blood work) drawn today and your tests are completely normal, you will receive your results only by MyChart Message (if you have MyChart) -OR-  A paper copy in the mail.  If you have any lab test that is abnormal or we need to change your treatment, we will call you to review these results.  Testing/Procedures: None Ordered  Follow-Up: Your physician recommends that you schedule a follow-up appointment in: 1 Month in HTN Clinic  At [facility], you and your health needs are our priority.  As part of our continuing mission to provide you with exceptional heart care, we have created designated Provider Care Teams.  These Care Teams include your primary Cardiologist (physician) and Advanced Practice Providers (APPs -  Physician Assistants and Nurse Practitioners) who all work together to provide you with the care you need, when you need it.  Thank you for choosing [facility] at [facility]!!

## 2018-06-02 ENCOUNTER — Encounter: Payer: Self-pay | Admitting: Family Medicine

## 2018-06-02 ENCOUNTER — Ambulatory Visit (INDEPENDENT_AMBULATORY_CARE_PROVIDER_SITE_OTHER): Payer: PPO | Admitting: Family Medicine

## 2018-06-02 VITALS — BP 130/60 | HR 68 | Temp 98.4°F | Ht 61.0 in | Wt 104.2 lb

## 2018-06-02 DIAGNOSIS — E871 Hypo-osmolality and hyponatremia: Secondary | ICD-10-CM | POA: Diagnosis not present

## 2018-06-02 DIAGNOSIS — R413 Other amnesia: Secondary | ICD-10-CM

## 2018-06-02 DIAGNOSIS — I10 Essential (primary) hypertension: Secondary | ICD-10-CM

## 2018-06-02 LAB — BASIC METABOLIC PANEL
BUN/Creatinine Ratio: 22 (calc) (ref 6–22)
BUN: 20 mg/dL (ref 7–25)
CO2: 29 mmol/L (ref 20–32)
Calcium: 9.5 mg/dL (ref 8.6–10.4)
Chloride: 95 mmol/L — ABNORMAL LOW (ref 98–110)
Creat: 0.92 mg/dL — ABNORMAL HIGH (ref 0.60–0.88)
GLUCOSE: 111 mg/dL — AB (ref 65–99)
Potassium: 4.7 mmol/L (ref 3.5–5.3)
Sodium: 133 mmol/L — ABNORMAL LOW (ref 135–146)

## 2018-06-02 NOTE — Patient Instructions (Addendum)
Stop mirabegron and sleep medication. Stay off HCTZ and continue norvasc and lisinopril.  Continue vit D supplementation 400 IU twice daily.  Please stop at the lab to have labs drawn.  Can use melatonin 3 mg at night.

## 2018-06-02 NOTE — Progress Notes (Signed)
Subjective:    Patient ID: Meredith Kramer, female    DOB: 14-May-1930, 85 y.o.   MRN: [id number]  HPI    85 year old female presents for follow up memory loss. Noted first after hospitalization.. significant per pt and daughter.   At last [facility] with labs... nml tsh, B12.  Sodium was found to be low  at 127 which might have been causing memory issues... stopped HCTZ.   Vit D low and started on supplement  Referred to cardiology ( Dr. Percival Spanish) for ? paroxsysmal afib and hypertensive urgency  Given 48 hour holter ( no documented afib on recording) Started on eliquis.  Given BP still elevated .Marland Kitchen Added norvasc 2.5 mg daily. Requested consideration of stopping mirabegron because it can cause hypertension  Today   She has noted no improvement in memory since hospitalization.  On hydralazine only taking if > 190. On lisinopril.   BP Readings from Last 3 Encounters:  06/02/18 130/60  06/01/18 (!) 162/62  05/02/18 (!) 158/60   Social History /Family History/Past Medical History reviewed in detail and updated in EMR if needed. Blood pressure 130/60, pulse 68, temperature 98.4 F (36.9 C), temperature source Oral, height 5\' 1"  (1.549 m), weight 104 lb 4 oz (47.3 kg), SpO2 97 %.  Review of Systems  Constitutional: Positive for fatigue. Negative for fever.  HENT: Negative for congestion.   Eyes: Negative for pain.  Respiratory: Negative for cough and shortness of breath.   Cardiovascular: Negative for chest pain, palpitations and leg swelling.  Gastrointestinal: Negative for abdominal pain.  Genitourinary: Negative for dysuria and vaginal bleeding.  Musculoskeletal: Negative for back pain.  Neurological: Negative for syncope, light-headedness and headaches.  Psychiatric/Behavioral: Positive for sleep disturbance. Negative for dysphoric mood.       Objective:   Physical Exam Constitutional:      General: She is not in acute distress.    Appearance: Normal  appearance. She is well-developed. She is not ill-appearing or toxic-appearing.  HENT:     Head: Normocephalic.     Right Ear: Hearing, tympanic membrane, ear canal and external ear normal. Tympanic membrane is not erythematous, retracted or bulging.     Left Ear: Hearing, tympanic membrane, ear canal and external ear normal. Tympanic membrane is not erythematous, retracted or bulging.     Nose: No mucosal edema or rhinorrhea.     Right Sinus: No maxillary sinus tenderness or frontal sinus tenderness.     Left Sinus: No maxillary sinus tenderness or frontal sinus tenderness.     Mouth/Throat:     Pharynx: Uvula midline.  Eyes:     General: Lids are normal. Lids are everted, no foreign bodies appreciated.     Conjunctiva/sclera: Conjunctivae normal.     Pupils: Pupils are equal, round, and reactive to light.  Neck:     Musculoskeletal: Normal range of motion and neck supple.     Thyroid: No thyroid mass or thyromegaly.     Vascular: No carotid bruit.     Trachea: Trachea normal.  Cardiovascular:     Rate and Rhythm: Normal rate and regular rhythm.     Pulses: Normal pulses.     Heart sounds: Normal heart sounds, S1 normal and S2 normal. No murmur. No friction rub. No gallop.   Pulmonary:     Effort: Pulmonary effort is normal. No tachypnea or respiratory distress.     Breath sounds: Normal breath sounds. No decreased breath sounds, wheezing, rhonchi or rales.  Abdominal:     General: Bowel sounds are normal.     Palpations: Abdomen is soft.     Tenderness: There is no abdominal tenderness.  Skin:    General: Skin is warm and dry.     Findings: No rash.  Neurological:     Mental Status: She is alert and oriented to person, place, and time.  Psychiatric:        Mood and Affect: Mood is not anxious or depressed.        Speech: Speech normal.        Behavior: Behavior normal. Behavior is cooperative.        Thought Content: Thought content normal.        Cognition and Memory: She  exhibits impaired recent memory.        Judgment: Judgment normal.      MMSE:27/30 short memory deficit, clock drawing nml  7 in 15 sec of animal recall.     Assessment & Plan:

## 2018-06-21 DIAGNOSIS — F0390 Unspecified dementia without behavioral disturbance: Secondary | ICD-10-CM | POA: Insufficient documentation

## 2018-06-21 DIAGNOSIS — F03B0 Unspecified dementia, moderate, without behavioral disturbance, psychotic disturbance, mood disturbance, and anxiety: Secondary | ICD-10-CM | POA: Insufficient documentation

## 2018-06-21 NOTE — Assessment & Plan Note (Signed)
Inadequate control and question of A fib in hospital.. refer to cardiology for further eval.

## 2018-06-21 NOTE — Assessment & Plan Note (Signed)
?   Secondary to recent hypertensive urgency.  Will eval for secondary causes with labs.  Close follow up for MMSE.

## 2018-06-23 DIAGNOSIS — E871 Hypo-osmolality and hyponatremia: Secondary | ICD-10-CM | POA: Insufficient documentation

## 2018-06-23 NOTE — Assessment & Plan Note (Signed)
Improved control on current regimen  

## 2018-06-23 NOTE — Assessment & Plan Note (Signed)
Stop all possible sedating medications. Eval with labs.  May be secondary to recent hypertensive urgency.

## 2018-06-23 NOTE — Assessment & Plan Note (Signed)
Stay off HCTZ and continue norvasc and lisinopril.

## 2018-07-04 ENCOUNTER — Ambulatory Visit (INDEPENDENT_AMBULATORY_CARE_PROVIDER_SITE_OTHER): Payer: PPO | Admitting: Pharmacist

## 2018-07-04 VITALS — BP 162/68 | HR 68 | Resp 15 | Ht 61.0 in | Wt 95.8 lb

## 2018-07-04 DIAGNOSIS — I10 Essential (primary) hypertension: Secondary | ICD-10-CM | POA: Diagnosis not present

## 2018-07-04 DIAGNOSIS — H5201 Hypermetropia, right eye: Secondary | ICD-10-CM | POA: Diagnosis not present

## 2018-07-04 DIAGNOSIS — H2513 Age-related nuclear cataract, bilateral: Secondary | ICD-10-CM | POA: Diagnosis not present

## 2018-07-04 DIAGNOSIS — H5212 Myopia, left eye: Secondary | ICD-10-CM | POA: Diagnosis not present

## 2018-07-04 DIAGNOSIS — H25093 Other age-related incipient cataract, bilateral: Secondary | ICD-10-CM | POA: Diagnosis not present

## 2018-07-04 MED ORDER — AMLODIPINE BESYLATE 5 MG PO TABS: 5.0000 mg | ORAL_TABLET | Freq: Every day | ORAL | 1 refills | Status: DC

## 2018-07-04 NOTE — Patient Instructions (Addendum)
Return for a  follow up appointment in 4 weeks  Check your blood pressure at home daily (if able) and keep record of the readings.  Take your BP meds as follows: *INCREASE amlodipine dose to 5mg  daily (may take 2 tablets of 2.5mg  daily until all gone)*  Bring all of your meds, your BP cuff and your record of home blood pressures to your next appointment.  Exercise as you're able, try to walk approximately 30 minutes per day.  Keep salt intake to a minimum, especially watch canned and prepared boxed foods.  Eat more fresh fruits and vegetables and fewer canned items.  Avoid eating in fast food restaurants.    HOW TO TAKE YOUR BLOOD PRESSURE: . Rest 5 minutes before taking your blood pressure. .  Don't smoke or drink caffeinated beverages for at least 30 minutes before. . Take your blood pressure before (not after) you eat. . Sit comfortably with your back supported and both feet on the floor (don't cross your legs). . Elevate your arm to heart level on a table or a desk. . Use the proper sized cuff. It should fit smoothly and snugly around your bare upper arm. There should be enough room to slip a fingertip under the cuff. The bottom edge of the cuff should be 1 inch above the crease of the elbow. . Ideally, take 3 measurements at one sitting and record the average.

## 2018-07-04 NOTE — Progress Notes (Signed)
Patient ID: Meredith Kramer                 DOB: 10-May-1930                      MRN: [id number]     HPI: Meredith Kramer is a 85 y.o. female referred by Dr. Percival Spanish to HTN clinic. PMH includes hypertensive urgency, hyponatremia, and Afib. Patient presents to clinic accompany by her daughter. Denies problems with current medication, and denies dizziness, falls, swelling, blurry vision or increase fatigue.   Current HTN meds:  Amlodipine 2.5mg  daily started by Dr Percival Spanish 06/01/2018 Lisinopril 20mg  daily Hydralazine 10mg  daily PRN SBP >190 - no dose needed in the last 4 weeks  Previously tried:  HCTZ  - hyponatremia  BP goal: 130/80  Family History: stoke in mother (age 47), MI in father (age 24), CAD with stent placement in brother  Social History: denies tobacco or alcohol use  Diet: canned products, deli meat, prepared foods  Exercise: goes to [facility] 3x/week for low impact exercises, and walks her doug 2-3 times per day  Home BP readings:  20 readings; average 148/68  Wt Readings from Last 3 Encounters:  07/04/18 95 lb 12.8 oz (43.5 kg)  06/02/18 104 lb 4 oz (47.3 kg)  06/01/18 104 lb 3.2 oz (47.3 kg)   BP Readings from Last 3 Encounters:  07/04/18 (!) 162/68  06/02/18 130/60  06/01/18 (!) 162/62   Pulse Readings from Last 3 Encounters:  07/04/18 68  06/02/18 68  06/01/18 79    Past Medical History:  Diagnosis Date  . Hyperlipidemia   . Osteopenia     Current Outpatient Medications on File Prior to Visit  Medication Sig Dispense Refill  . alendronate (FOSAMAX) 70 MG tablet TAKE 1 TABLET BY MOUTH EVERY 7 DAYS TAKE ON EMPTY STOMACH WITH FULL GLASS OF WATER 12 tablet 1  . apixaban (ELIQUIS) 2.5 MG TABS tablet Take 1 tablet (2.5 mg total) by mouth 2 (two) times daily. 60 tablet 11  . Calcium Carbonate-Vitamin D (CALTRATE 600+D) 600-400 MG-UNIT per tablet Take 1 tablet by mouth daily.     . hydrALAZINE (APRESOLINE) 10 MG tablet Take 1 tablet daily for systolic blood  pressure >932 30 tablet 5  . lisinopril (PRINIVIL,ZESTRIL) 20 MG tablet Take 1 tablet (20 mg total) by mouth daily. 90 tablet 3   No current facility-administered medications on file prior to visit.     Allergies  Allergen Reactions  . Doxycycline Hyclate     REACTION: rash  . Erythromycin Base     REACTION: rash  . Penicillins     Has patient had a PCN reaction causing immediate rash, facial/tongue/throat swelling, SOB or lightheadedness with hypotension: Unknown Has patient had a PCN reaction causing severe rash involving mucus membranes or skin necrosis: Unknown Has patient had a PCN reaction that required hospitalization: No Has patient had a PCN reaction occurring within the last 10 years: No If all of the above answers are "NO", then may proceed with Cephalosporin use.     Blood pressure (!) 162/68, pulse 68, resp. rate 15, height 5\' 1"  (1.549 m), weight 95 lb 12.8 oz (43.5 kg).  Essential hypertension Blood pressure remains above desired goal and unchanged since intimating amlodipine. Due to hyponatremia precipitated by HCTZ, will avoid diuretic at this time. Will increase amlodipine to 5mg  daily and follow up in 4 weeks. Plan to increase lisinopril dose during next office visit is  additional BP control is needed.   Laelynn Blizzard Rodriguez-Guzman PharmD, BCPS, [place] [address] [place] [address] 07/04/2018 12:30 PM

## 2018-07-04 NOTE — Assessment & Plan Note (Signed)
Blood pressure remains above desired goal and unchanged since intimating amlodipine. Due to hyponatremia precipitated by HCTZ, will avoid diuretic at this time. Will increase amlodipine to 5mg  daily and follow up in 4 weeks. Plan to increase lisinopril dose during next office visit is additional BP control is needed.

## 2018-07-11 ENCOUNTER — Ambulatory Visit: Payer: PPO | Admitting: Family Medicine

## 2018-07-14 ENCOUNTER — Encounter: Payer: Self-pay | Admitting: Family Medicine

## 2018-07-14 ENCOUNTER — Ambulatory Visit (INDEPENDENT_AMBULATORY_CARE_PROVIDER_SITE_OTHER): Payer: PPO | Admitting: Family Medicine

## 2018-07-14 DIAGNOSIS — F03A0 Unspecified dementia, mild, without behavioral disturbance, psychotic disturbance, mood disturbance, and anxiety: Secondary | ICD-10-CM

## 2018-07-14 DIAGNOSIS — R636 Underweight: Secondary | ICD-10-CM

## 2018-07-14 DIAGNOSIS — F0390 Unspecified dementia without behavioral disturbance: Secondary | ICD-10-CM

## 2018-07-14 DIAGNOSIS — N3281 Overactive bladder: Secondary | ICD-10-CM | POA: Diagnosis not present

## 2018-07-14 MED ORDER — DONEPEZIL HCL 5 MG PO TBDP: 5.0000 mg | ORAL_TABLET | Freq: Every day | ORAL | 11 refills | Status: DC

## 2018-07-14 NOTE — Assessment & Plan Note (Addendum)
Patient is minimally aware of memory issues, but has delayed answers for many questions and appears confused about meds.  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 minimal improvement per daughter after stopping sedating meds, normalizing BP and sodium.   Will start aricept low dose and re-eval at 09/2018 appt.

## 2018-07-14 NOTE — Assessment & Plan Note (Signed)
Now sith wrose symptoms off myrbetriq but pt not bothered. Will hold off on restarting medication to treat.

## 2018-07-14 NOTE — Patient Instructions (Signed)
Can try melatonin for sleep.  Start low dose aricept for memory stability. Call if any upset stomach, or weight loss, decreased appetite etc.

## 2018-07-14 NOTE — Assessment & Plan Note (Signed)
BMI in nml range now... need to follow closely after starting aricept to look for SE.

## 2018-07-14 NOTE — Progress Notes (Signed)
Subjective:    Patient ID: Meredith Kramer, female    DOB: [birth year], 85 y.o.   MRN: [id number]  HPI  85 year old female pt presents for 4 week follow up multiple issues.   She is now going to HTN clinic per Dr. Percival Spanish. First appt on 07/04/2017  Increased amlodipine to 5 mg daily.  Today BP appears to be at goal on current regimen.  Now off Myrbetric which can contribute. Amlodipine 5mg  daily  Lisinopril 20mg  daily Hydralazine 10mg  daily PRN SBP >190 - no dose needed in the last 4 weeks  BP Readings from Last 3 Encounters:  07/14/18 130/60  07/04/18 (!) 162/68  06/02/18 130/60    Hyponatremia due to HCTZ.. improved at last check almost normal  127->133 06/02/2018  Memory loss.. likely multifactorial.. in part possibly due to hypertensive emergency Now off mirbegron and sleep medication.  She feels as though memory is better.. she denies any issues.  She has noted return of nocturia 3 times per night. Feels rested when she wakes up.  Using melatonin 3 mg at night for insomnia    Wt Readings from Last 3 Encounters:  07/14/18 103 lb 8 oz (46.9 kg)  07/04/18 95 lb 12.8 oz (43.5 kg)  06/02/18 104 lb 4 oz (47.3 kg)     Social History /Family History/Past Medical History reviewed in detail and updated in EMR if needed. Blood pressure 130/60, pulse 61, temperature 97.8 F (36.6 C), temperature source Oral, height 5\' 1"  (1.549 m), weight 103 lb 8 oz (46.9 kg), SpO2 97 %.  Review of Systems  Constitutional: Negative for fatigue and fever.  HENT: Negative for congestion.   Eyes: Negative for pain.  Respiratory: Negative for cough and shortness of breath.   Cardiovascular: Negative for chest pain, palpitations and leg swelling.  Gastrointestinal: Negative for abdominal pain.  Genitourinary: Negative for dysuria and vaginal bleeding.  Musculoskeletal: Negative for back pain.  Neurological: Negative for syncope, light-headedness and headaches.  Psychiatric/Behavioral:  Negative for dysphoric mood.       Objective:   Physical Exam Constitutional:      General: She is not in acute distress.    Appearance: Normal appearance. She is well-developed. She is not ill-appearing or toxic-appearing.     Comments: Elderly female in NAD  HENT:     Head: Normocephalic.     Right Ear: Hearing, tympanic membrane, ear canal and external ear normal. Tympanic membrane is not erythematous, retracted or bulging.     Left Ear: Hearing, tympanic membrane, ear canal and external ear normal. Tympanic membrane is not erythematous, retracted or bulging.     Nose: No mucosal edema or rhinorrhea.     Right Sinus: No maxillary sinus tenderness or frontal sinus tenderness.     Left Sinus: No maxillary sinus tenderness or frontal sinus tenderness.     Mouth/Throat:     Pharynx: Uvula midline.  Eyes:     General: Lids are normal. Lids are everted, no foreign bodies appreciated.     Conjunctiva/sclera: Conjunctivae normal.     Pupils: Pupils are equal, round, and reactive to light.  Neck:     Musculoskeletal: Normal range of motion and neck supple.     Thyroid: No thyroid mass or thyromegaly.     Vascular: No carotid bruit.     Trachea: Trachea normal.  Cardiovascular:     Rate and Rhythm: Normal rate and regular rhythm.     Pulses: Normal pulses.  Heart sounds: Normal heart sounds, S1 normal and S2 normal. No murmur. No friction rub. No gallop.   Pulmonary:     Effort: Pulmonary effort is normal. No tachypnea or respiratory distress.     Breath sounds: Normal breath sounds. No decreased breath sounds, wheezing, rhonchi or rales.  Abdominal:     General: Bowel sounds are normal.     Palpations: Abdomen is soft.     Tenderness: There is no abdominal tenderness.  Skin:    General: Skin is warm and dry.     Findings: No rash.  Neurological:     Mental Status: She is alert.  Psychiatric:        Mood and Affect: Mood is not anxious or depressed.        Speech: Speech  normal.        Behavior: Behavior normal. Behavior is cooperative.        Thought Content: Thought content normal.        Judgment: Judgment normal.           Assessment & Plan:

## 2018-07-27 ENCOUNTER — Other Ambulatory Visit: Payer: Self-pay | Admitting: Cardiology

## 2018-07-31 ENCOUNTER — Ambulatory Visit (INDEPENDENT_AMBULATORY_CARE_PROVIDER_SITE_OTHER): Payer: PPO | Admitting: Pharmacist Clinician (PhC)/ Clinical Pharmacy Specialist

## 2018-07-31 ENCOUNTER — Other Ambulatory Visit: Payer: Self-pay

## 2018-07-31 DIAGNOSIS — I10 Essential (primary) hypertension: Secondary | ICD-10-CM

## 2018-07-31 NOTE — Patient Instructions (Signed)
Call if you notice your home blood pressure readings increasing to < 150 consistently at home  Your blood pressure today is 136/56  Check your blood pressure at home up to twice daily and keep record of the readings.  Take your BP meds as follows:  Continue with your current medications - switch either the lisinopril or amlodipine to evenings.   Bring all of your meds, your BP cuff and your record of home blood pressures to your next appointment.  Exercise as you're able, try to walk approximately 30 minutes per day.  Keep salt intake to a minimum, especially watch canned and prepared boxed foods.  Eat more fresh fruits and vegetables and fewer canned items.  Avoid eating in fast food restaurants.    HOW TO TAKE YOUR BLOOD PRESSURE: . Rest 5 minutes before taking your blood pressure. .  Don't smoke or drink caffeinated beverages for at least 30 minutes before. . Take your blood pressure before (not after) you eat. . Sit comfortably with your back supported and both feet on the floor (don't cross your legs). . Elevate your arm to heart level on a table or a desk. . Use the proper sized cuff. It should fit smoothly and snugly around your bare upper arm. There should be enough room to slip a fingertip under the cuff. The bottom edge of the cuff should be 1 inch above the crease of the elbow. . Ideally, take 3 measurements at one sitting and record the average.

## 2018-07-31 NOTE — Progress Notes (Signed)
Patient ID: Meredith Kramer                 DOB: February 11, 1930                      MRN: [id number]     HPI: Meredith Kramer is a 85 y.o. female referred by Dr. Percival Spanish to HTN clinic. PMH includes hypertensive urgency, hyponatremia, and Afib.  When he saw Dr. Percival Spanish in January his BP was elevated at 162/62.  Amlodipine 2.5 mg daily was added and after 4 weeks was increased to 5 mg daily when he was seen in [facility].    Today patient returns for follow up appointment accompanied by her daughter. She denies any problems with current medication, denies dizziness, falls, swelling, blurry vision or increase fatigue.   Current HTN meds:  Amlodipine 5mg  daily  Lisinopril 20mg  daily Hydralazine 10mg  daily PRN SBP >190 - no dose needed in the last 4 weeks  Previously tried:  HCTZ  - hyponatremia  BP goal: 130/80  Family History: stoke in mother (age 44), MI in father (age 62), CAD with stent placement in brother  Social History: denies tobacco or alcohol use  Diet: canned products, deli meat, prepared foods, some frozen veggies, no added salt  Exercise: goes to [facility] 3x/week for low impact exercises, and walks her dog 2-3 times per day  - Y cancelled for time being  Home BP readings:  Did not bring any readings with her today, but notes that for the past several days all readings have been in the 130s and low 140s.   She notes that she usually takes her BP on the right arm and it reads 5-10 points higher than the left.  Wt Readings from Last 3 Encounters:  07/31/18 104 lb 6.4 oz (47.4 kg)  07/14/18 103 lb 8 oz (46.9 kg)  07/04/18 95 lb 12.8 oz (43.5 kg)   BP Readings from Last 3 Encounters:  07/31/18 (!) 136/56  07/14/18 130/60  07/04/18 (!) 162/68   Pulse Readings from Last 3 Encounters:  07/14/18 61  07/04/18 68  06/02/18 68    Past Medical History:  Diagnosis Date  . Hyperlipidemia   . Osteopenia     Current Outpatient Medications on File Prior to Visit  Medication Sig  Dispense Refill  . alendronate (FOSAMAX) 70 MG tablet TAKE 1 TABLET BY MOUTH EVERY 7 DAYS TAKE ON EMPTY STOMACH WITH FULL GLASS OF WATER 12 tablet 1  . amLODipine (NORVASC) 5 MG tablet TAKE 1 TABLET BY MOUTH EVERY DAY 30 tablet 5  . apixaban (ELIQUIS) 2.5 MG TABS tablet Take 1 tablet (2.5 mg total) by mouth 2 (two) times daily. 60 tablet 11  . Calcium Carbonate-Vitamin D (CALTRATE 600+D) 600-400 MG-UNIT per tablet Take 1 tablet by mouth daily.     Marland Kitchen donepezil (ARICEPT ODT) 5 MG disintegrating tablet Take 1 tablet (5 mg total) by mouth at bedtime. 30 tablet 11  . hydrALAZINE (APRESOLINE) 10 MG tablet Take 1 tablet daily for systolic blood pressure >101 30 tablet 5  . lisinopril (PRINIVIL,ZESTRIL) 20 MG tablet Take 1 tablet (20 mg total) by mouth daily. 90 tablet 3   No current facility-administered medications on file prior to visit.     Allergies  Allergen Reactions  . Doxycycline Hyclate     REACTION: rash  . Erythromycin Base     REACTION: rash  . Penicillins     Has patient had a PCN reaction  causing immediate rash, facial/tongue/throat swelling, SOB or lightheadedness with hypotension: Unknown Has patient had a PCN reaction causing severe rash involving mucus membranes or skin necrosis: Unknown Has patient had a PCN reaction that required hospitalization: No Has patient had a PCN reaction occurring within the last 10 years: No If all of the above answers are "NO", then may proceed with Cephalosporin use.     Blood pressure (!) 136/56, height 5\' 1"  (1.549 m), weight 104 lb 6.4 oz (47.4 kg).  Right arm 144/52  Essential hypertension Patient with essential hypertension, currently well controlled.  She is to continue with her current medications and regular home BP checks.  I asked that she call the office should she note an increas in her home readings to consistently > 150 in the right arm.    Tommy Medal PharmD CPP [facility] [address]  [place] [address] 08/01/2018 8:25 AM

## 2018-08-01 NOTE — Assessment & Plan Note (Signed)
Patient with essential hypertension, currently well controlled.  She is to continue with her current medications and regular home BP checks.  I asked that she call the office should she note an increas in her home readings to consistently > 150 in the right arm.

## 2018-08-06 ENCOUNTER — Other Ambulatory Visit: Payer: Self-pay | Admitting: Family Medicine

## 2018-10-04 ENCOUNTER — Telehealth: Payer: Self-pay | Admitting: Family Medicine

## 2018-10-04 NOTE — Telephone Encounter (Signed)
Spoke with Meredith Kramer.  They are going out of town and she was wanting to take something so she wouldn't have to go to the bathroom every couple of hours.  She checked and she still has some Myrbetriq 25 mg tablets.  She just forgot what they were for.  She will start taking the Myrbetriq and will call back if she needs anything else.  Myrbetriq added back to medication list.

## 2018-10-04 NOTE — Telephone Encounter (Signed)
Left message asking pt to call office see if pt is ok with coming into office  If so put IN OFFICE in appointment note.  If not see if pt wants to do virtal or phone visit appointmetn

## 2018-10-04 NOTE — Telephone Encounter (Signed)
Best number [phone] Pt called to get a refill  Oxybutynin   She is going out of town Friday and would like to get this before leaving she has called her pharmacy they told her to call here  [facility] Torres [phone]

## 2018-10-04 NOTE — Telephone Encounter (Signed)
Oxybutynin is not on current medication list.  Please advise.

## 2018-10-04 NOTE — Telephone Encounter (Signed)
Call pt to clarify.Meredith Kramer or set up virtual visit if wishes to discuss options.   Was on oxybutynin in 2017-2018... more recently in 2019 mirbegron/myrbetriq. At last OV note 05/2018 was off both.. with noticeable changes, but chose to not restart. I would recommend restart of myrbetriq unless she has other concern and prefers oxybutynin.

## 2018-10-05 ENCOUNTER — Ambulatory Visit (INDEPENDENT_AMBULATORY_CARE_PROVIDER_SITE_OTHER): Payer: PPO

## 2018-10-05 DIAGNOSIS — Z0000 Encounter for general adult medical examination without abnormal findings: Secondary | ICD-10-CM

## 2018-10-05 NOTE — Progress Notes (Signed)
Subjective:   Meredith Kramer is a 85 y.o. female who presents for Medicare Annual (Subsequent) preventive examination.  Review of Systems:  N/A Cardiac Risk Factors include: advanced age (>49men, >19 women);dyslipidemia     Objective:     Vitals: There were no vitals taken for this visit.  There is no height or weight on file to calculate BMI.  Advanced Directives 10/05/2018 04/21/2018 04/21/2018 09/20/2017 09/10/2016  Does Patient Have a Medical Advance Directive? Yes No No Yes Yes  Type of Paramedic of [place];Living will - - [place];Living will [place];Living will  Copy of [place] in Chart? No - copy requested - - No - copy requested No - copy requested  Would patient like information on creating a medical advance directive? - No - Patient declined No - Patient declined - -    Tobacco Social History   Tobacco Use  Smoking Status Never Smoker  Smokeless Tobacco Never Used     Counseling given: No   Clinical Intake:  Pre-visit preparation completed: Yes  Pain : No/denies pain Pain Score: 0-No pain     Nutritional Status: BMI of 19-24  Normal Nutritional Risks: None  How often do you need to have someone help you when you read instructions, pamphlets, or other written materials from your doctor or pharmacy?: 1 - Never What is the last grade level you completed in school?: 12th grade  Interpreter Needed?: No  Comments: pt is a widow and lives independently Information entered by :: LPinson, LPN  Past Medical History:  Diagnosis Date  . Hyperlipidemia   . Osteopenia    Past Surgical History:  Procedure Laterality Date  . TONSILLECTOMY     Family History  Problem Relation Age of Onset  . Stroke Mother        age 20  . Emphysema Father        [employer]  . Coronary artery disease Father        MI age 92  . Coronary artery disease Brother        stent  . Cancer  Neg Hx    Social History   Socioeconomic History  . Marital status: Married    Spouse name: Not on file  . Number of children: Not on file  . Years of education: Not on file  . Highest education level: Not on file  Occupational History  . Not on file  Social Needs  . Financial resource strain: Not on file  . Food insecurity:    Worry: Not on file    Inability: Not on file  . Transportation needs:    Medical: Not on file    Non-medical: Not on file  Tobacco Use  . Smoking status: Never Smoker  . Smokeless tobacco: Never Used  Substance and Sexual Activity  . Alcohol use: No  . Drug use: No  . Sexual activity: Not Currently  Lifestyle  . Physical activity:    Days per week: Not on file    Minutes per session: Not on file  . Stress: Not on file  Relationships  . Social connections:    Talks on phone: Not on file    Gets together: Not on file    Attends religious service: Not on file    Active member of club or organization: Not on file    Attends meetings of clubs or organizations: Not on file    Relationship status:  Not on file  Other Topics Concern  . Not on file  Social History Narrative   Regular exercise- yes.  Going to jazz class 4 days a week, daily walk.   Diet- fruits and veggies, milk, water.    Outpatient Encounter Medications as of 10/05/2018  Medication Sig  . alendronate (FOSAMAX) 70 MG tablet TAKE 1 TABLET BY MOUTH EVERY 7 DAYS TAKE ON EMPTY STOMACH WITH FULL GLASS OF WATER  . amLODipine (NORVASC) 5 MG tablet TAKE 1 TABLET BY MOUTH EVERY DAY  . apixaban (ELIQUIS) 2.5 MG TABS tablet Take 1 tablet (2.5 mg total) by mouth 2 (two) times daily.  . Calcium Carbonate-Vitamin D (CALTRATE 600+D) 600-400 MG-UNIT per tablet Take 1 tablet by mouth daily.   Marland Kitchen donepezil (ARICEPT ODT) 5 MG disintegrating tablet Take 1 tablet (5 mg total) by mouth at bedtime.  . hydrALAZINE (APRESOLINE) 10 MG tablet Take 1 tablet daily for systolic blood pressure >299  . lisinopril  (PRINIVIL,ZESTRIL) 20 MG tablet Take 1 tablet (20 mg total) by mouth daily.  . mirabegron ER (MYRBETRIQ) 25 MG TB24 tablet Take 25 mg by mouth daily.   No facility-administered encounter medications on file as of 10/05/2018.     Activities of Daily Living In your present state of health, do you have any difficulty performing the following activities: 10/05/2018 04/21/2018  Hearing? Y N  Vision? N N  Difficulty concentrating or making decisions? Y N  Walking or climbing stairs? N N  Dressing or bathing? N N  Doing errands, shopping? N N  Preparing Food and eating ? N -  Using the Toilet? N -  In the past six months, have you accidently leaked urine? N -  Do you have problems with loss of bowel control? N -  Managing your Medications? N -  Managing your Finances? N -  Housekeeping or managing your Housekeeping? N -  Some recent data might be hidden    Patient Care Team: Jinny Sanders, MD as PCP - General    Assessment:   This is a routine wellness examination for Meredith Kramer.  Hearing Screening Comments: Bilateral hearing aids Vision Screening Comments: Vision exam in 2019   Exercise Activities and Dietary recommendations Current Exercise Habits: Structured exercise class;Home exercise routine, Type of exercise: walking, Time (Minutes): 60, Frequency (Times/Week): 3, Weekly Exercise (Minutes/Week): 180, Intensity: Moderate, Exercise limited by: None identified  Goals    . Patient Stated     Starting 10/05/2018, I will continue to take medications as prescribed.        Fall Risk Fall Risk  10/05/2018 09/20/2017 09/10/2016 07/18/2015 07/11/2014  Falls in the past year? 0 No No No No   Depression Screen PHQ 2/9 Scores 10/05/2018 09/20/2017 09/10/2016 07/18/2015  PHQ - 2 Score 0 0 0 0  PHQ- 9 Score 0 0 - -     Cognitive Function MMSE - Mini Mental State Exam 10/05/2018 09/20/2017 09/10/2016  Orientation to time 5 5 5   Orientation to Place 5 5 5   Registration 3 3 3   Attention/ Calculation  0 0 0  Recall 0 1 3  Recall-comments unable to recall 3 of 3 words unable to recall 2 of 3 words -  Language- name 2 objects 0 0 0  Language- repeat 1 1 1   Language- follow 3 step command 0 3 3  Language- read & follow direction 0 0 0  Write a sentence 0 0 0  Copy design 0 0 0  Total score 14  18 20     PLEASE NOTE: A Mini-Cog screen was completed. Maximum score is 17. A value of 0 denotes this part of [place] MMSE was not completed or the patient failed this part of the Mini-Cog screening.   Mini-Cog Screening Orientation to Time - Max 5 pts Orientation to Place - Max 5 pts Registration - Max 3 pts Recall - Max 3 pts Language Repeat - Max 1 pts      Immunization History  Administered Date(s) Administered  . Influenza Split 03/01/2012  . Influenza Whole 04/28/2007, 02/20/2008, 01/28/2010  . Influenza, High Dose Seasonal PF 02/20/2014, 02/15/2015, 03/10/2016, 01/12/2018  . Influenza,inj,Quad PF,6+ Mos 02/01/2017  . Pneumococcal Conjugate-13 07/11/2014  . Pneumococcal Polysaccharide-23 04/24/2010  . Td 04/28/2007  . Zoster 04/24/2010    Screening Tests Health Maintenance  Topic Date Due  . MAMMOGRAM  05/16/2020 (Originally 07/13/2016)  . TETANUS/TDAP  05/16/2020 (Originally 04/27/2017)  . INFLUENZA VACCINE  12/16/2018  . DEXA SCAN  Completed  . PNA vac Low Risk Adult  Completed      Plan:     I have personally reviewed, addressed, and noted the following in the patient's chart:  A. Medical and social history B. Use of alcohol, tobacco or illicit drugs  C. Current medications and supplements D. Functional ability and status E.  Nutritional status F.  Physical activity G. Advance directives H. List of other physicians I.  Hospitalizations, surgeries, and ER visits in previous 12 months J.  Vitals (unless it is a telemedicine encounter) K. Screenings to include cognitive, depression, hearing, vision (NOTE: hearing and vision screenings not completed in telemedicine  encounter) L. Referrals and appointments   In addition, I have reviewed and discussed with patient certain preventive protocols, quality metrics, and best practice recommendations. A written personalized care plan for preventive services and recommendations were provided to patient.  Interactive audio and video telecommunications were attempted with patient. This attempt was unsuccessful due to patient having technical difficulties OR patient did not have access to video capability.  Encounter was completed with audio only.  Two patient identifiers were used to ensure the encounter occurred with the correct person.   Patient was in home and writer was in office.   Signed,   Lindell Noe, MHA, BS, LPN Health Coach

## 2018-10-05 NOTE — Progress Notes (Signed)
I reviewed health advisor's note, was available for consultation, and agree with documentation and plan.   Signed,  Chanler Mendonca T. Johnathon Olden, MD  

## 2018-10-05 NOTE — Patient Instructions (Signed)
Meredith Kramer , Thank you for taking time to come for your Medicare Wellness Visit. I appreciate your ongoing commitment to your health goals. Please review the following plan we discussed and let me know if I can assist you in the future.   These are the goals we discussed: Goals    . Patient Stated     Starting 10/05/2018, I will continue to take medications as prescribed.        This is a list of the screening recommended for you and due dates:  Health Maintenance  Topic Date Due  . Mammogram  05/16/2020*  . Tetanus Vaccine  05/16/2020*  . Flu Shot  12/16/2018  . DEXA scan (bone density measurement)  Completed  . Pneumonia vaccines  Completed  *Topic was postponed. The date shown is not the original due date.   Preventive Care for Adults  A healthy lifestyle and preventive care can promote health and wellness. Preventive health guidelines for adults include the following key practices.  . A routine yearly physical is a good way to check with your health care provider about your health and preventive screening. It is a chance to share any concerns and updates on your health and to receive a thorough exam.  . Visit your dentist for a routine exam and preventive care every 6 months. Brush your teeth twice a day and floss once a day. Good oral hygiene prevents tooth decay and gum disease.  . The frequency of eye exams is based on your age, health, family medical history, use  of contact lenses, and other factors. Follow your health care provider's recommendations for frequency of eye exams.  . Eat a healthy diet. Foods like vegetables, fruits, whole grains, low-fat dairy products, and lean protein foods contain the nutrients you need without too many calories. Decrease your intake of foods high in solid fats, added sugars, and salt. Eat the right amount of calories for you. Get information about a proper diet from your health care provider, if necessary.  . Regular physical exercise is  one of the most important things you can do for your health. Most adults should get at least 150 minutes of moderate-intensity exercise (any activity that increases your heart rate and causes you to sweat) each week. In addition, most adults need muscle-strengthening exercises on 2 or more days a week.  Silver Sneakers may be a benefit available to you. To determine eligibility, you may visit the website: www.silversneakers.com or contact program at 901-026-5491 Mon-Fri between 8AM-8PM.   . Maintain a healthy weight. The body mass index (BMI) is a screening tool to identify possible weight problems. It provides an estimate of body fat based on height and weight. Your health care provider can find your BMI and can help you achieve or maintain a healthy weight.   For adults 20 years and older: ? A BMI below 18.5 is considered underweight. ? A BMI of 18.5 to 24.9 is normal. ? A BMI of 25 to 29.9 is considered overweight. ? A BMI of 30 and above is considered obese.   . Maintain normal blood lipids and cholesterol levels by exercising and minimizing your intake of saturated fat. Eat a balanced diet with plenty of fruit and vegetables. Blood tests for lipids and cholesterol should begin at age 68 and be repeated every 5 years. If your lipid or cholesterol levels are high, you are over 50, or you are at high risk for heart disease, you may need your cholesterol levels  checked more frequently. Ongoing high lipid and cholesterol levels should be treated with medicines if diet and exercise are not working.  . If you smoke, find out from your health care provider how to quit. If you do not use tobacco, please do not start.  . If you choose to drink alcohol, please do not consume more than 2 drinks per day. One drink is considered to be 12 ounces (355 mL) of beer, 5 ounces (148 mL) of wine, or 1.5 ounces (44 mL) of liquor.  . If you are 64-59 years old, ask your health care provider if you should take  aspirin to prevent strokes.  . Use sunscreen. Apply sunscreen liberally and repeatedly throughout the day. You should seek shade when your shadow is shorter than you. Protect yourself by wearing long sleeves, pants, a wide-brimmed hat, and sunglasses year round, whenever you are outdoors.  . Once a month, do a whole body skin exam, using a mirror to look at the skin on your back. Tell your health care provider of new moles, moles that have irregular borders, moles that are larger than a pencil eraser, or moles that have changed in shape or color.

## 2018-10-05 NOTE — Progress Notes (Signed)
PCP notes:   Health maintenance:  Tetanus vaccine - postponed/insurance Mammogram - postponed  Abnormal screenings:   Mini-Cog score: 14/17 MMSE - Mini Mental State Exam 10/05/2018 09/20/2017 09/10/2016  Orientation to time 5 5 5   Orientation to Place 5 5 5   Registration 3 3 3   Attention/ Calculation 0 0 0  Recall 0 1 3  Recall-comments unable to recall 3 of 3 words unable to recall 2 of 3 words -  Language- name 2 objects 0 0 0  Language- repeat 1 1 1   Language- follow 3 step command 0 3 3  Language- read & follow direction 0 0 0  Write a sentence 0 0 0  Copy design 0 0 0  Total score 14 18 20    Patient concerns:   Urinary frequency - clarified with patient the correct medication to take for this concern  Nurse concerns:  None  Next PCP appt:   10/12/18 @ 1000

## 2018-10-10 ENCOUNTER — Telehealth: Payer: Self-pay | Admitting: Family Medicine

## 2018-10-10 DIAGNOSIS — M810 Age-related osteoporosis without current pathological fracture: Secondary | ICD-10-CM

## 2018-10-10 DIAGNOSIS — E782 Mixed hyperlipidemia: Secondary | ICD-10-CM

## 2018-10-10 DIAGNOSIS — R7303 Prediabetes: Secondary | ICD-10-CM

## 2018-10-10 NOTE — Telephone Encounter (Signed)
-----   Message from Eustace Pen, LPN sent at 06/13/1186  8:28 AM EDT ----- Regarding: Labs 5/27 Lab orders needed. Thank you.   Neg COVID screen. Curbside instructions given.

## 2018-10-11 ENCOUNTER — Other Ambulatory Visit (INDEPENDENT_AMBULATORY_CARE_PROVIDER_SITE_OTHER): Payer: PPO

## 2018-10-11 DIAGNOSIS — E782 Mixed hyperlipidemia: Secondary | ICD-10-CM

## 2018-10-11 DIAGNOSIS — M810 Age-related osteoporosis without current pathological fracture: Secondary | ICD-10-CM

## 2018-10-11 DIAGNOSIS — R7303 Prediabetes: Secondary | ICD-10-CM

## 2018-10-11 LAB — COMPREHENSIVE METABOLIC PANEL
ALT: 21 U/L (ref 0–35)
AST: 29 U/L (ref 0–37)
Albumin: 4.1 g/dL (ref 3.5–5.2)
Alkaline Phosphatase: 37 U/L — ABNORMAL LOW (ref 39–117)
BUN: 19 mg/dL (ref 6–23)
CO2: 31 mEq/L (ref 19–32)
Calcium: 9.3 mg/dL (ref 8.4–10.5)
Chloride: 95 mEq/L — ABNORMAL LOW (ref 96–112)
Creatinine, Ser: 0.63 mg/dL (ref 0.40–1.20)
GFR: 89.49 mL/min (ref >60.00)
Glucose, Bld: 106 mg/dL — ABNORMAL HIGH (ref 70–99)
Potassium: 4.6 mEq/L (ref 3.5–5.1)
Sodium: 133 mEq/L — ABNORMAL LOW (ref 135–145)
Total Bilirubin: 0.4 mg/dL (ref 0.2–1.2)
Total Protein: 6.6 g/dL (ref 6.0–8.3)

## 2018-10-11 LAB — LIPID PANEL
Cholesterol: 185 mg/dL (ref 0–200)
HDL: 69.5 mg/dL (ref >39.00)
LDL Cholesterol: 100 mg/dL — ABNORMAL HIGH (ref 0–99)
NonHDL: 115.43
Total CHOL/HDL Ratio: 3
Triglycerides: 77 mg/dL (ref 0.0–149.0)
VLDL: 15.4 mg/dL (ref 0.0–40.0)

## 2018-10-11 LAB — HEMOGLOBIN A1C: Hgb A1c MFr Bld: 6.3 % (ref 4.6–6.5)

## 2018-10-11 LAB — VITAMIN D 25 HYDROXY (VIT D DEFICIENCY, FRACTURES): VITD: 23.68 ng/mL — ABNORMAL LOW (ref 30.00–100.00)

## 2018-10-12 ENCOUNTER — Ambulatory Visit (INDEPENDENT_AMBULATORY_CARE_PROVIDER_SITE_OTHER): Payer: PPO | Admitting: Family Medicine

## 2018-10-12 ENCOUNTER — Encounter: Payer: Self-pay | Admitting: Family Medicine

## 2018-10-12 ENCOUNTER — Other Ambulatory Visit: Payer: Self-pay

## 2018-10-12 VITALS — BP 150/60 | HR 71 | Temp 98.7°F | Ht 61.0 in | Wt 108.5 lb

## 2018-10-12 DIAGNOSIS — M810 Age-related osteoporosis without current pathological fracture: Secondary | ICD-10-CM | POA: Diagnosis not present

## 2018-10-12 DIAGNOSIS — E782 Mixed hyperlipidemia: Secondary | ICD-10-CM | POA: Diagnosis not present

## 2018-10-12 DIAGNOSIS — F03A0 Unspecified dementia, mild, without behavioral disturbance, psychotic disturbance, mood disturbance, and anxiety: Secondary | ICD-10-CM

## 2018-10-12 DIAGNOSIS — Z0000 Encounter for general adult medical examination without abnormal findings: Secondary | ICD-10-CM

## 2018-10-12 DIAGNOSIS — R7303 Prediabetes: Secondary | ICD-10-CM

## 2018-10-12 DIAGNOSIS — N3281 Overactive bladder: Secondary | ICD-10-CM | POA: Diagnosis not present

## 2018-10-12 DIAGNOSIS — E871 Hypo-osmolality and hyponatremia: Secondary | ICD-10-CM | POA: Diagnosis not present

## 2018-10-12 DIAGNOSIS — F0390 Unspecified dementia without behavioral disturbance: Secondary | ICD-10-CM

## 2018-10-12 DIAGNOSIS — I10 Essential (primary) hypertension: Secondary | ICD-10-CM

## 2018-10-12 NOTE — Assessment & Plan Note (Signed)
She reports stable control off medication in past... onnly needed something on a trip.

## 2018-10-12 NOTE — Assessment & Plan Note (Signed)
IMproved and stable.

## 2018-10-12 NOTE — Assessment & Plan Note (Signed)
Slight worsening. reivewed low carb diet.

## 2018-10-12 NOTE — Assessment & Plan Note (Signed)
Well controlled. Continue current medication.  

## 2018-10-12 NOTE — Assessment & Plan Note (Signed)
Slight increase after a few days of myrbetriq... given she does not think she needs this .. will stop.  Continue other meds.

## 2018-10-12 NOTE — Progress Notes (Signed)
Chief Complaint  Patient presents with  . Annual Exam    Part 2    History of Present Illness: HPI  The patient presents for complete physical and review of chronic health problems. He/She also has the following acute concerns today:  The patient saw Candis Musa, LPN for medicare wellness. Note reviewed in detail and important notes copied below. Health maintenance:  Tetanus vaccine - postponed/insurance Mammogram - postponed  Abnormal screenings:   Mini-Cog score: 14/17 MMSE - Mini Mental State Exam 10/05/2018 09/20/2017 09/10/2016  Orientation to time 5 5 5   Orientation to Place 5 5 5   Registration 3 3 3   Attention/ Calculation 0 0 0  Recall 0 1 3  Recall-comments unable to recall 3 of 3 words unable to recall 2 of 3 words -  Language- name 2 objects 0 0 0  Language- repeat 1 1 1   Language- follow 3 step command 0 3 3  Language- read & follow direction 0 0 0  Write a sentence 0 0 0  Copy design 0 0 0  Total score 14 18 20    Patient concerns:  Urinary frequency - clarified with patient the correct medication to take for this concern  10/12/18 Hypertension: More elevated today than previously. Previously well controlled on   Amlodipine 5mg  daily  Lisinopril 20mg  daily, Hydralazine 10mg  daily PRN SBP >190 - no dose needed in the last 4 weeks BP Readings from Last 3 Encounters:  10/12/18 (!) 150/60  07/31/18 (!) 136/56  07/14/18 130/60  Using medication without problems or lightheadedness:  none Chest pain with exertion:none Edema:none Short of breath:none Average home BPs: Other issues: Followed by cardiology Dr. Percival Spanish  Mild dementia: Following hypertensive emergency hospitalization.  Now on donezipil 5 mg since 06/2018. No SE. Memory no better, but not worse.  Urinary frequency and incontinence: now back on myrbetriq ( only took 2-3 3 of these)... but BP elevated more than in past.. ? due to this med  She is not really bothered  With this unless she  goes on a trip and does not plan to do this for another 2 years.  Elevated Cholesterol:  Lab Results  Component Value Date   CHOL 185 10/11/2018   HDL 69.50 10/11/2018   LDLCALC 100 (H) 10/11/2018   TRIG 77.0 10/11/2018   CHOLHDL 3 10/11/2018  Using medications without problems: Muscle aches:  Diet compliance: good Exercise: walking dog 3-4 times a day Other complaints:   prediabetes  Lab Results  Component Value Date   HGBA1C 6.3 10/11/2018     COVID 19 screen No recent travel or known exposure to [place] The patient denies respiratory symptoms of COVID 19 at this time.  The importance of social distancing was discussed today.   ROS    Past Medical History:  Diagnosis Date  . Hyperlipidemia   . Osteopenia     reports that she has never smoked. She has never used smokeless tobacco. She reports that she does not drink alcohol or use drugs.   Current Outpatient Medications:  .  alendronate (FOSAMAX) 70 MG tablet, TAKE 1 TABLET BY MOUTH EVERY 7 DAYS TAKE ON EMPTY STOMACH WITH FULL GLASS OF WATER, Disp: 12 tablet, Rfl: 1 .  amLODipine (NORVASC) 5 MG tablet, TAKE 1 TABLET BY MOUTH EVERY DAY, Disp: 30 tablet, Rfl: 5 .  apixaban (ELIQUIS) 2.5 MG TABS tablet, Take 1 tablet (2.5 mg total) by mouth 2 (two) times daily., Disp: 60 tablet, Rfl: 11 .  Calcium Carbonate-Vitamin  D (CALTRATE 600+D) 600-400 MG-UNIT per tablet, Take 1 tablet by mouth daily. , Disp: , Rfl:  .  donepezil (ARICEPT ODT) 5 MG disintegrating tablet, Take 1 tablet (5 mg total) by mouth at bedtime., Disp: 30 tablet, Rfl: 11 .  hydrALAZINE (APRESOLINE) 10 MG tablet, Take 1 tablet daily for systolic blood pressure >121, Disp: 30 tablet, Rfl: 5 .  lisinopril (PRINIVIL,ZESTRIL) 20 MG tablet, Take 1 tablet (20 mg total) by mouth daily., Disp: 90 tablet, Rfl: 3 .  mirabegron ER (MYRBETRIQ) 25 MG TB24 tablet, Take 25 mg by mouth daily., Disp: , Rfl:    Observations/Objective: Blood pressure (!) 150/60, pulse 71,  temperature 98.7 F (37.1 C), temperature source Oral, height 5\' 1"  (1.549 m), weight 108 lb 8 oz (49.2 kg), SpO2 98 %.  Physical Exam Constitutional:      General: She is not in acute distress.    Appearance: Normal appearance. She is well-developed. She is not ill-appearing or toxic-appearing.  HENT:     Head: Normocephalic.     Right Ear: Hearing, tympanic membrane, ear canal and external ear normal.     Left Ear: Hearing, tympanic membrane, ear canal and external ear normal.     Nose: Nose normal.  Eyes:     General: Lids are normal. Lids are everted, no foreign bodies appreciated.     Conjunctiva/sclera: Conjunctivae normal.     Pupils: Pupils are equal, round, and reactive to light.  Neck:     Musculoskeletal: Normal range of motion and neck supple.     Thyroid: No thyroid mass or thyromegaly.     Vascular: No carotid bruit.     Trachea: Trachea normal.  Cardiovascular:     Rate and Rhythm: Normal rate and regular rhythm.     Heart sounds: Normal heart sounds, S1 normal and S2 normal. No murmur. No gallop.   Pulmonary:     Effort: Pulmonary effort is normal. No respiratory distress.     Breath sounds: Normal breath sounds. No wheezing, rhonchi or rales.  Abdominal:     General: Bowel sounds are normal. There is no distension or abdominal bruit.     Palpations: Abdomen is soft. There is no fluid wave or mass.     Tenderness: There is no abdominal tenderness. There is no guarding or rebound.     Hernia: No hernia is present.  Lymphadenopathy:     Cervical: No cervical adenopathy.  Skin:    General: Skin is warm and dry.     Findings: No rash.  Neurological:     Mental Status: She is alert.     Cranial Nerves: No cranial nerve deficit.     Sensory: No sensory deficit.  Psychiatric:        Mood and Affect: Mood is not anxious or depressed.        Speech: Speech normal.        Behavior: Behavior normal. Behavior is cooperative.        Judgment: Judgment normal.       Assessment and Plan   The patient's preventative maintenance and recommended screening tests for an annual wellness exam were reviewed in full today. Brought up to date unless services declined.  Counselled on the importance of diet, exercise, and its role in overall health and mortality. The patient's FH and SH was reviewed, including their home life, tobacco status, and drug and alcohol status.   Vaccines: uptodate  DEXA: 2018  Continued osteoporosis off fosamax. Due for repeat  in 2020 after August Mammo: 07/14/2015, not indicated Colon: 07/2005 Dr. Fuller Plan...adenomatous polyps, Not indicated  Pap/DVE: not indicated  Nonsmoker  Essential hypertension Slight increase after a few days of myrbetriq... given she does not think she needs this .. will stop.  Continue other meds.  OAB (overactive bladder) She reports stable control off medication in past... onnly needed something on a trip.  Prediabetes Slight worsening. reivewed low carb diet.  Hyperlipidemia Well controlled. Continue current medication.   Mild dementia ([place]) Stable control per pt on aricept low dose. Repeat MMSE in 6 months.  Hyponatremia IMproved and stable.    Eliezer Lofts, MD

## 2018-10-12 NOTE — Assessment & Plan Note (Signed)
Stable control per pt on aricept low dose. Repeat MMSE in 6 months.

## 2018-10-12 NOTE — Patient Instructions (Addendum)
Work on decreasing carbohydrates as able, keep up with daily walking.  Limit sweet drinks.. drink more water.   Increase ca and VITD to twice daily.   We will call you to set up bone density.

## 2018-11-28 ENCOUNTER — Encounter: Payer: Self-pay | Admitting: Family Medicine

## 2018-11-28 DIAGNOSIS — M810 Age-related osteoporosis without current pathological fracture: Secondary | ICD-10-CM | POA: Diagnosis not present

## 2018-11-28 DIAGNOSIS — Z1231 Encounter for screening mammogram for malignant neoplasm of breast: Secondary | ICD-10-CM | POA: Diagnosis not present

## 2018-11-28 LAB — HM DEXA SCAN

## 2018-12-12 DIAGNOSIS — C44619 Basal cell carcinoma of skin of left upper limb, including shoulder: Secondary | ICD-10-CM | POA: Diagnosis not present

## 2018-12-12 DIAGNOSIS — D2272 Melanocytic nevi of left lower limb, including hip: Secondary | ICD-10-CM | POA: Diagnosis not present

## 2018-12-12 DIAGNOSIS — C44519 Basal cell carcinoma of skin of other part of trunk: Secondary | ICD-10-CM | POA: Diagnosis not present

## 2018-12-12 DIAGNOSIS — Z85820 Personal history of malignant melanoma of skin: Secondary | ICD-10-CM | POA: Diagnosis not present

## 2018-12-12 DIAGNOSIS — D2271 Melanocytic nevi of right lower limb, including hip: Secondary | ICD-10-CM | POA: Diagnosis not present

## 2018-12-12 DIAGNOSIS — D225 Melanocytic nevi of trunk: Secondary | ICD-10-CM | POA: Diagnosis not present

## 2018-12-12 DIAGNOSIS — D2261 Melanocytic nevi of right upper limb, including shoulder: Secondary | ICD-10-CM | POA: Diagnosis not present

## 2018-12-12 DIAGNOSIS — D2262 Melanocytic nevi of left upper limb, including shoulder: Secondary | ICD-10-CM | POA: Diagnosis not present

## 2018-12-12 DIAGNOSIS — D485 Neoplasm of uncertain behavior of skin: Secondary | ICD-10-CM | POA: Diagnosis not present

## 2018-12-12 DIAGNOSIS — Z85828 Personal history of other malignant neoplasm of skin: Secondary | ICD-10-CM | POA: Diagnosis not present

## 2018-12-12 DIAGNOSIS — Z08 Encounter for follow-up examination after completed treatment for malignant neoplasm: Secondary | ICD-10-CM | POA: Diagnosis not present

## 2018-12-25 DIAGNOSIS — C44619 Basal cell carcinoma of skin of left upper limb, including shoulder: Secondary | ICD-10-CM | POA: Diagnosis not present

## 2018-12-25 DIAGNOSIS — C44519 Basal cell carcinoma of skin of other part of trunk: Secondary | ICD-10-CM | POA: Diagnosis not present

## 2018-12-26 ENCOUNTER — Encounter: Payer: Self-pay | Admitting: Family Medicine

## 2018-12-29 ENCOUNTER — Encounter: Payer: Self-pay | Admitting: Family Medicine

## 2018-12-29 ENCOUNTER — Telehealth: Payer: Self-pay | Admitting: *Deleted

## 2018-12-29 NOTE — Telephone Encounter (Signed)
I have not been able to reach patient by phone on 3 attempts.  Voicemail left but no return calls.  Letter mailed to patient with her bone density results.

## 2019-01-18 ENCOUNTER — Other Ambulatory Visit: Payer: Self-pay | Admitting: Cardiology

## 2019-04-17 ENCOUNTER — Encounter: Payer: Self-pay | Admitting: Family Medicine

## 2019-04-17 ENCOUNTER — Other Ambulatory Visit: Payer: Self-pay

## 2019-04-17 ENCOUNTER — Ambulatory Visit (INDEPENDENT_AMBULATORY_CARE_PROVIDER_SITE_OTHER): Payer: PPO | Admitting: Family Medicine

## 2019-04-17 ENCOUNTER — Ambulatory Visit: Payer: PPO | Admitting: Family Medicine

## 2019-04-17 VITALS — BP 140/58 | HR 76 | Temp 98.3°F | Ht 61.0 in | Wt 111.5 lb

## 2019-04-17 DIAGNOSIS — F0390 Unspecified dementia without behavioral disturbance: Secondary | ICD-10-CM | POA: Diagnosis not present

## 2019-04-17 DIAGNOSIS — I10 Essential (primary) hypertension: Secondary | ICD-10-CM | POA: Diagnosis not present

## 2019-04-17 DIAGNOSIS — F03A0 Unspecified dementia, mild, without behavioral disturbance, psychotic disturbance, mood disturbance, and anxiety: Secondary | ICD-10-CM

## 2019-04-17 NOTE — Progress Notes (Signed)
Chief Complaint  Patient presents with  . Follow-up    Dementia    History of Present Illness: HPI    85 year old female presents for 6 month follow up dementia  Dementia, mild: on low dose aricept, no SE.  She is at appt alone today... not much insight on memory issues.Marland Kitchen denies problems.  Repeat MMSE today: 27/30.Marland Kitchen short term memory and date only issues.  Hypertension:   Tolerable control on current regimen.  Using medication without problems or lightheadedness:  Chest pain with exertion: Edema: Short of breath: Average home BPs: Other issues:  Wt Readings from Last 3 Encounters:  04/17/19 111 lb 8 oz (50.6 kg)  10/12/18 108 lb 8 oz (49.2 kg)  07/31/18 104 lb 6.4 oz (47.4 kg)     This visit occurred during the SARS-CoV-2 public health emergency.  Safety protocols were in place, including screening questions prior to the visit, additional usage of staff PPE, and extensive cleaning of exam room while observing appropriate contact time as indicated for disinfecting solutions.   COVID 19 screen:  No recent travel or known exposure to COVID19 The patient denies respiratory symptoms of COVID 19 at this time. The importance of social distancing was discussed today.     Review of Systems  Constitutional: Negative for chills and fever.  HENT: Negative for congestion and ear pain.   Eyes: Negative for pain and redness.  Respiratory: Negative for cough and shortness of breath.   Cardiovascular: Negative for chest pain, palpitations and leg swelling.  Gastrointestinal: Negative for abdominal pain, blood in stool, constipation, diarrhea, nausea and vomiting.  Genitourinary: Negative for dysuria.  Musculoskeletal: Negative for falls and myalgias.  Skin: Negative for rash.  Neurological: Negative for dizziness.  Psychiatric/Behavioral: Negative for depression. The patient is not nervous/anxious.       Past Medical History:  Diagnosis Date  . Hyperlipidemia   . Osteopenia      reports that she has never smoked. She has never used smokeless tobacco. She reports that she does not drink alcohol or use drugs.   Current Outpatient Medications:  .  alendronate (FOSAMAX) 70 MG tablet, TAKE 1 TABLET BY MOUTH EVERY 7 DAYS TAKE ON EMPTY STOMACH WITH FULL GLASS OF WATER, Disp: 12 tablet, Rfl: 1 .  amLODipine (NORVASC) 5 MG tablet, TAKE 1 TABLET BY MOUTH EVERY DAY, Disp: 90 tablet, Rfl: 1 .  apixaban (ELIQUIS) 2.5 MG TABS tablet, Take 1 tablet (2.5 mg total) by mouth 2 (two) times daily., Disp: 60 tablet, Rfl: 11 .  Calcium Carbonate-Vitamin D (CALTRATE 600+D) 600-400 MG-UNIT per tablet, Take 1 tablet by mouth daily. , Disp: , Rfl:  .  donepezil (ARICEPT ODT) 5 MG disintegrating tablet, Take 1 tablet (5 mg total) by mouth at bedtime., Disp: 30 tablet, Rfl: 11 .  hydrALAZINE (APRESOLINE) 10 MG tablet, Take 1 tablet daily for systolic blood pressure [id number], Disp: 30 tablet, Rfl: 5 .  lisinopril (PRINIVIL,ZESTRIL) 20 MG tablet, Take 1 tablet (20 mg total) by mouth daily., Disp: 90 tablet, Rfl: 3   Observations/Objective: Blood pressure (!) 140/58, pulse 76, temperature 98.3 F (36.8 C), temperature source Temporal, height 5\' 1"  (1.549 m), weight 111 lb 8 oz (50.6 kg), SpO2 99 %.  Physical Exam Constitutional:      General: She is not in acute distress.    Appearance: Normal appearance. She is well-developed. She is not ill-appearing or toxic-appearing.  HENT:     Head: Normocephalic.     Right Ear: Hearing,  tympanic membrane, ear canal and external ear normal. Tympanic membrane is not erythematous, retracted or bulging.     Left Ear: Hearing, tympanic membrane, ear canal and external ear normal. Tympanic membrane is not erythematous, retracted or bulging.     Nose: No mucosal edema or rhinorrhea.     Right Sinus: No maxillary sinus tenderness or frontal sinus tenderness.     Left Sinus: No maxillary sinus tenderness or frontal sinus tenderness.     Mouth/Throat:      Pharynx: Uvula midline.  Eyes:     General: Lids are normal. Lids are everted, no foreign bodies appreciated.     Conjunctiva/sclera: Conjunctivae normal.     Pupils: Pupils are equal, round, and reactive to light.  Neck:     Musculoskeletal: Normal range of motion and neck supple.     Thyroid: No thyroid mass or thyromegaly.     Vascular: No carotid bruit.     Trachea: Trachea normal.  Cardiovascular:     Rate and Rhythm: Normal rate and regular rhythm.     Pulses: Normal pulses.     Heart sounds: Normal heart sounds, S1 normal and S2 normal. No murmur. No friction rub. No gallop.   Pulmonary:     Effort: Pulmonary effort is normal. No tachypnea or respiratory distress.     Breath sounds: Normal breath sounds. No decreased breath sounds, wheezing, rhonchi or rales.  Abdominal:     General: Bowel sounds are normal.     Palpations: Abdomen is soft.     Tenderness: There is no abdominal tenderness.  Skin:    General: Skin is warm and dry.     Findings: No rash.  Neurological:     Mental Status: She is alert.  Psychiatric:        Mood and Affect: Mood is not anxious or depressed.        Speech: Speech normal.        Behavior: Behavior normal. Behavior is cooperative.        Thought Content: Thought content normal.        Judgment: Judgment normal.      Assessment and Plan Essential hypertension   Tolerable control on current regimen.  Mild dementia (HCC) Stable MMSE on aricept low dose. Asymptomatic.       Eliezer Lofts, MD

## 2019-04-17 NOTE — Assessment & Plan Note (Signed)
Tolerable control on current regimen.

## 2019-04-17 NOTE — Patient Instructions (Addendum)
Try to walk some as tolerate.  Make sure to keep up with protein and eat 3 meals a day. Continue current medications.

## 2019-04-17 NOTE — Assessment & Plan Note (Signed)
Stable MMSE on aricept low dose. Asymptomatic.

## 2019-05-04 ENCOUNTER — Other Ambulatory Visit: Payer: Self-pay | Admitting: Family Medicine

## 2019-07-12 ENCOUNTER — Other Ambulatory Visit: Payer: Self-pay | Admitting: Cardiology

## 2019-07-16 ENCOUNTER — Other Ambulatory Visit: Payer: Self-pay | Admitting: *Deleted

## 2019-07-16 MED ORDER — AMLODIPINE BESYLATE 5 MG PO TABS: 5.0000 mg | ORAL_TABLET | Freq: Every day | ORAL | 0 refills | Status: DC

## 2019-08-13 IMAGING — CR DG CHEST 2V
1 series · 2 of 2 positions shown · non-contrast
Comparison: None.

CLINICAL DATA: Hypertension.

EXAM:
CHEST - 2 VIEW

[Series 1: dg chest 2 view · 0.14mm/px · 2 of 2 slices shown]
[im 1/2]
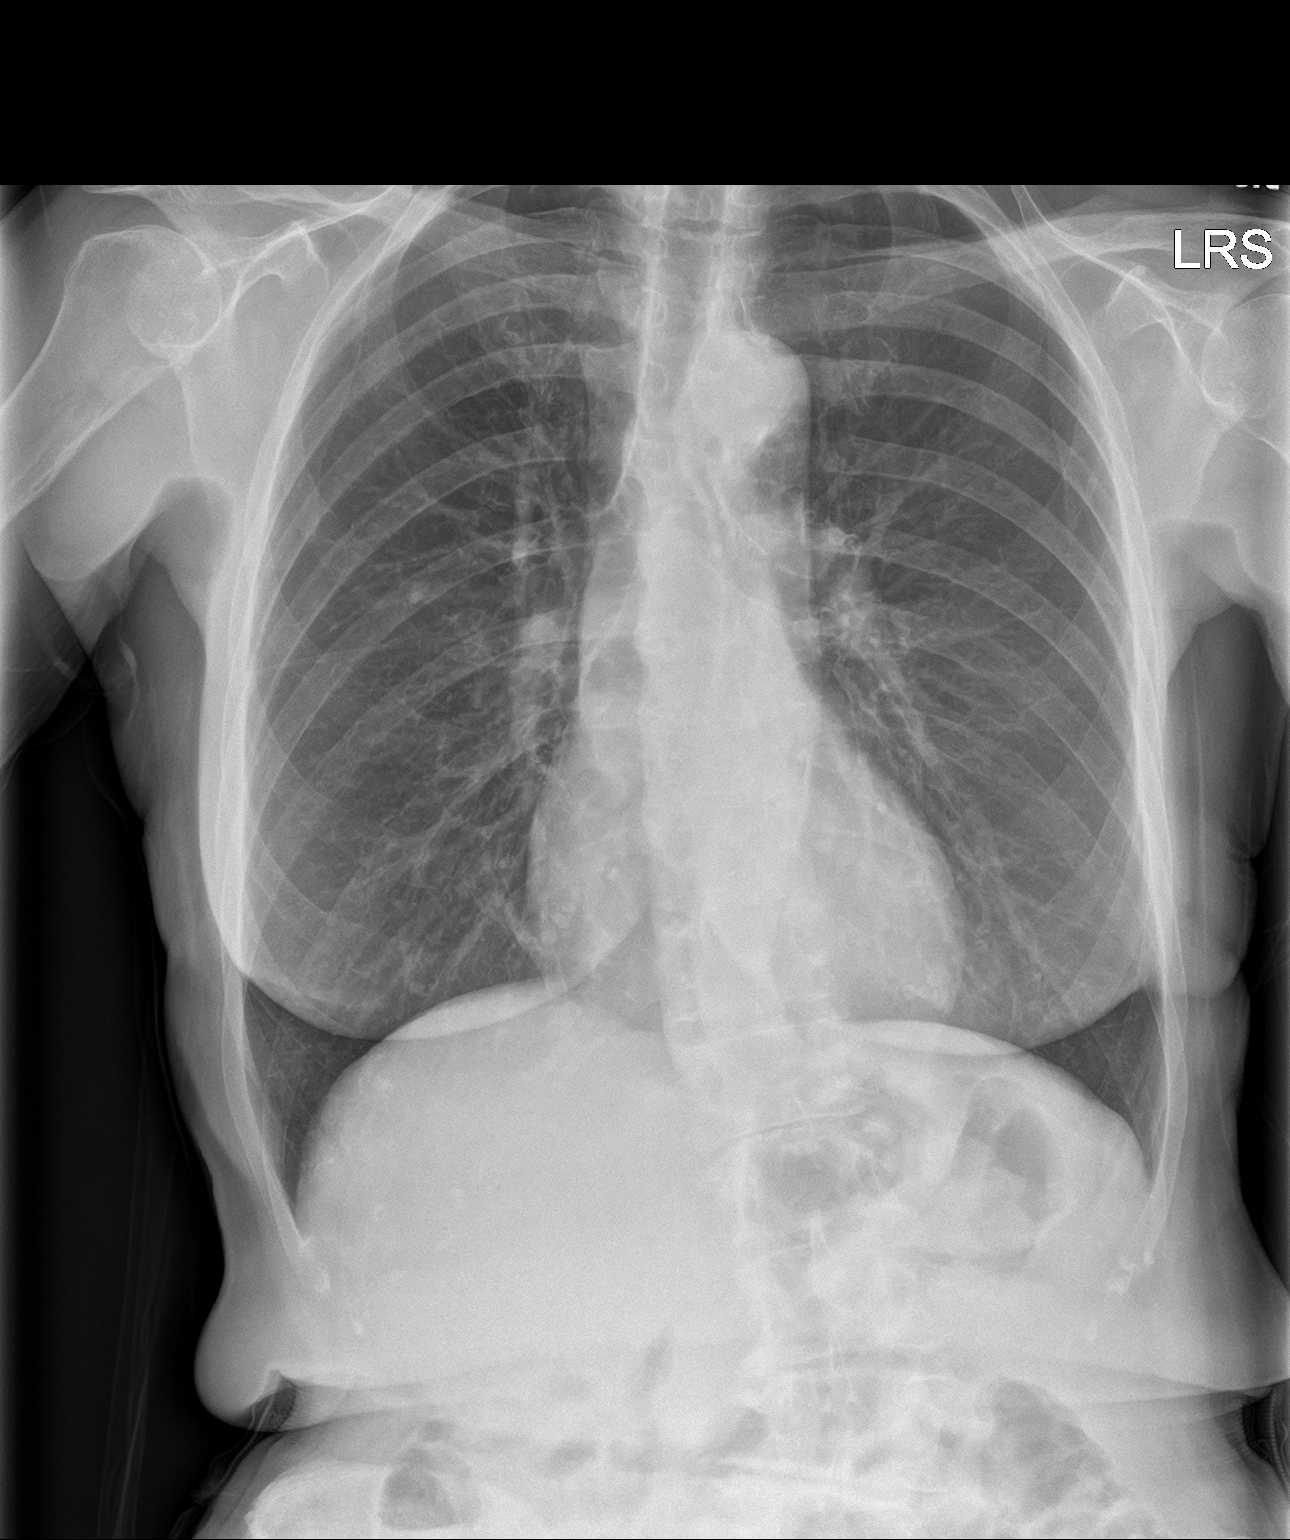
[im 2/2]
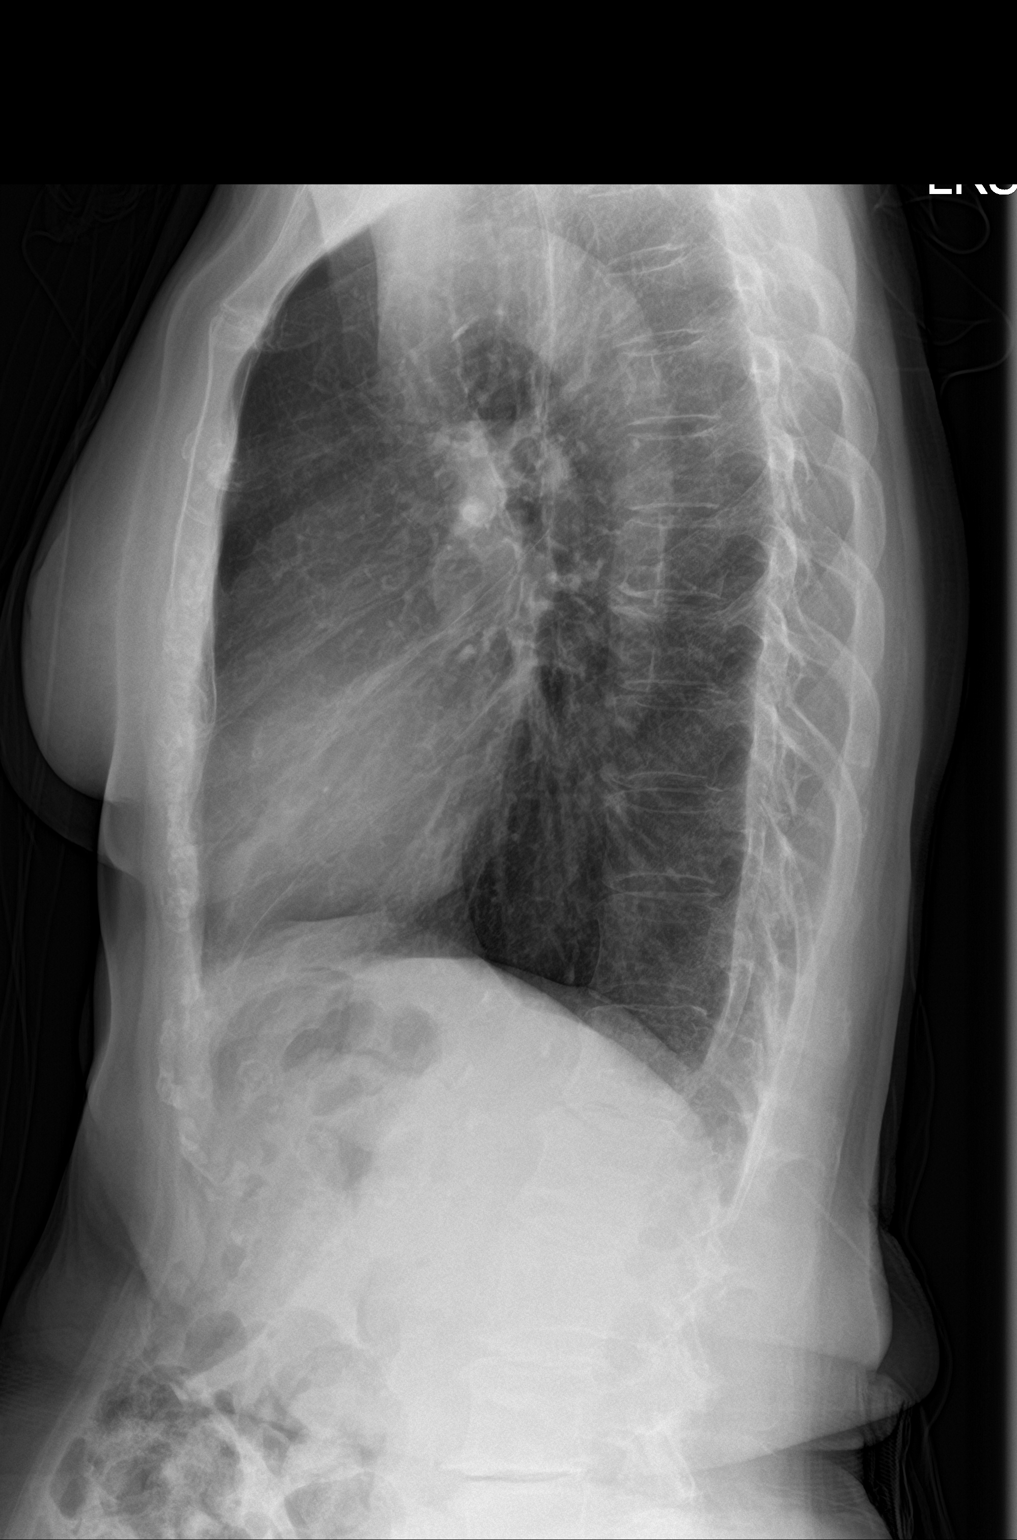

[2 of 2 positions shown; findings below may reference images not displayed]

FINDINGS: The cardiomediastinal silhouette is unremarkable.

There is no evidence of focal airspace disease, pulmonary edema,
suspicious pulmonary nodule/mass, pleural effusion, or pneumothorax.

No acute bony abnormalities are identified.
IMPRESSION: No active cardiopulmonary disease.

## 2019-08-20 DIAGNOSIS — D485 Neoplasm of uncertain behavior of skin: Secondary | ICD-10-CM | POA: Diagnosis not present

## 2019-08-20 DIAGNOSIS — Z85820 Personal history of malignant melanoma of skin: Secondary | ICD-10-CM | POA: Diagnosis not present

## 2019-08-20 DIAGNOSIS — C44619 Basal cell carcinoma of skin of left upper limb, including shoulder: Secondary | ICD-10-CM | POA: Diagnosis not present

## 2019-08-20 DIAGNOSIS — Z85828 Personal history of other malignant neoplasm of skin: Secondary | ICD-10-CM | POA: Diagnosis not present

## 2019-08-20 DIAGNOSIS — D2261 Melanocytic nevi of right upper limb, including shoulder: Secondary | ICD-10-CM | POA: Diagnosis not present

## 2019-08-20 DIAGNOSIS — L821 Other seborrheic keratosis: Secondary | ICD-10-CM | POA: Diagnosis not present

## 2019-08-20 DIAGNOSIS — D2272 Melanocytic nevi of left lower limb, including hip: Secondary | ICD-10-CM | POA: Diagnosis not present

## 2019-09-24 DIAGNOSIS — C44619 Basal cell carcinoma of skin of left upper limb, including shoulder: Secondary | ICD-10-CM | POA: Diagnosis not present

## 2019-10-14 ENCOUNTER — Other Ambulatory Visit: Payer: Self-pay | Admitting: Cardiology

## 2019-10-16 ENCOUNTER — Ambulatory Visit (INDEPENDENT_AMBULATORY_CARE_PROVIDER_SITE_OTHER): Payer: PPO

## 2019-10-16 ENCOUNTER — Telehealth: Payer: Self-pay | Admitting: Family Medicine

## 2019-10-16 DIAGNOSIS — Z0000 Encounter for general adult medical examination without abnormal findings: Secondary | ICD-10-CM | POA: Diagnosis not present

## 2019-10-16 DIAGNOSIS — E782 Mixed hyperlipidemia: Secondary | ICD-10-CM

## 2019-10-16 DIAGNOSIS — E559 Vitamin D deficiency, unspecified: Secondary | ICD-10-CM

## 2019-10-16 DIAGNOSIS — R7303 Prediabetes: Secondary | ICD-10-CM

## 2019-10-16 NOTE — Telephone Encounter (Signed)
-----   Message from Cloyd Stagers, RT sent at 10/03/2019  1:26 PM EDT ----- Regarding: Lab Orders for Wednesday 6.2.2021 Please place lab orders for Wednesday 6.2.2021, office visit for physical on Tuesday 6.8.2021 Thank you, Dyke Maes RT(R)

## 2019-10-16 NOTE — Patient Instructions (Signed)
Meredith Kramer , Thank you for taking time to come for your Medicare Wellness Visit. I appreciate your ongoing commitment to your health goals. Please review the following plan we discussed and let me know if I can assist you in the future.   Screening recommendations/referrals: Colonoscopy: no longer required Mammogram: Up to date, completed 11/28/2018 Bone Density: Up to date, completed 11/28/2018 Recommended yearly ophthalmology/optometry visit for glaucoma screening and checkup Recommended yearly dental visit for hygiene and checkup  Vaccinations: Influenza vaccine: Up to date, completed 01/24/2019 Pneumococcal vaccine: Completed series Tdap vaccine: decline Shingles vaccine: discussed    Advanced directives: Please bring a copy of your POA (Power of Attorney) and/or Living Will to your next appointment.   Conditions/risks identified: hypertension, hyperlipidemia  Next appointment: 10/25/2019 @ 11 am    Preventive Care 65 Years and Older, Female Preventive care refers to lifestyle choices and visits with your health care provider that can promote health and wellness. What does preventive care include?  A yearly physical exam. This is also called an annual well check.  Dental exams once or twice a year.  Routine eye exams. Ask your health care provider how often you should have your eyes checked.  Personal lifestyle choices, including:  Daily care of your teeth and gums.  Regular physical activity.  Eating a healthy diet.  Avoiding tobacco and drug use.  Limiting alcohol use.  Practicing safe sex.  Taking low-dose aspirin every day.  Taking vitamin and mineral supplements as recommended by your health care provider. What happens during an annual well check? The services and screenings done by your health care provider during your annual well check will depend on your age, overall health, lifestyle risk factors, and family history of disease. Counseling  Your health  care provider may ask you questions about your:  Alcohol use.  Tobacco use.  Drug use.  Emotional well-being.  Home and relationship well-being.  Sexual activity.  Eating habits.  History of falls.  Memory and ability to understand (cognition).  Work and work Statistician.  Reproductive health. Screening  You may have the following tests or measurements:  Height, weight, and BMI.  Blood pressure.  Lipid and cholesterol levels. These may be checked every 5 years, or more frequently if you are over 59 years old.  Skin check.  Lung cancer screening. You may have this screening every year starting at age 48 if you have a 30-pack-year history of smoking and currently smoke or have quit within the past 15 years.  Fecal occult blood test (FOBT) of the stool. You may have this test every year starting at age 24.  Flexible sigmoidoscopy or colonoscopy. You may have a sigmoidoscopy every 5 years or a colonoscopy every 10 years starting at age 69.  Hepatitis C blood test.  Hepatitis B blood test.  Sexually transmitted disease (STD) testing.  Diabetes screening. This is done by checking your blood sugar (glucose) after you have not eaten for a while (fasting). You may have this done every 1-3 years.  Bone density scan. This is done to screen for osteoporosis. You may have this done starting at age 53.  Mammogram. This may be done every 1-2 years. Talk to your health care provider about how often you should have regular mammograms. Talk with your health care provider about your test results, treatment options, and if necessary, the need for more tests. Vaccines  Your health care provider may recommend certain vaccines, such as:  Influenza vaccine. This is recommended  every year.  Tetanus, diphtheria, and acellular pertussis (Tdap, Td) vaccine. You may need a Td booster every 10 years.  Zoster vaccine. You may need this after age 39.  Pneumococcal 13-valent conjugate  (PCV13) vaccine. One dose is recommended after age 16.  Pneumococcal polysaccharide (PPSV23) vaccine. One dose is recommended after age 70. Talk to your health care provider about which screenings and vaccines you need and how often you need them. This information is not intended to replace advice given to you by your health care provider. Make sure you discuss any questions you have with your health care provider. Document Released: 05/30/2015 Document Revised: 01/21/2016 Document Reviewed: 03/04/2015 Elsevier Interactive Patient Education  2017 [place] Prevention in the Home Falls can cause injuries. They can happen to people of all ages. There are many things you can do to make your home safe and to help prevent falls. What can I do on the outside of my home?  Regularly fix the edges of walkways and driveways and fix any cracks.  Remove anything that might make you trip as you walk through a door, such as a raised step or threshold.  Trim any bushes or trees on the path to your home.  Use bright outdoor lighting.  Clear any walking paths of anything that might make someone trip, such as rocks or tools.  Regularly check to see if handrails are loose or broken. Make sure that both sides of any steps have handrails.  Any raised decks and porches should have guardrails on the edges.  Have any leaves, snow, or ice cleared regularly.  Use sand or salt on walking paths during winter.  Clean up any spills in your garage right away. This includes oil or grease spills. What can I do in the bathroom?  Use night lights.  Install grab bars by the toilet and in the tub and shower. Do not use towel bars as grab bars.  Use non-skid mats or decals in the tub or shower.  If you need to sit down in the shower, use a plastic, non-slip stool.  Keep the floor dry. Clean up any water that spills on the floor as soon as it happens.  Remove soap buildup in the tub or shower  regularly.  Attach bath mats securely with double-sided non-slip rug tape.  Do not have throw rugs and other things on the floor that can make you trip. What can I do in the bedroom?  Use night lights.  Make sure that you have a light by your bed that is easy to reach.  Do not use any sheets or blankets that are too big for your bed. They should not hang down onto the floor.  Have a firm chair that has side arms. You can use this for support while you get dressed.  Do not have throw rugs and other things on the floor that can make you trip. What can I do in the kitchen?  Clean up any spills right away.  Avoid walking on wet floors.  Keep items that you use a lot in easy-to-reach places.  If you need to reach something above you, use a strong step stool that has a grab bar.  Keep electrical cords out of the way.  Do not use floor polish or wax that makes floors slippery. If you must use wax, use non-skid floor wax.  Do not have throw rugs and other things on the floor that can make you trip. What  can I do with my stairs?  Do not leave any items on the stairs.  Make sure that there are handrails on both sides of the stairs and use them. Fix handrails that are broken or loose. Make sure that handrails are as long as the stairways.  Check any carpeting to make sure that it is firmly attached to the stairs. Fix any carpet that is loose or worn.  Avoid having throw rugs at the top or bottom of the stairs. If you do have throw rugs, attach them to the floor with carpet tape.  Make sure that you have a light switch at the top of the stairs and the bottom of the stairs. If you do not have them, ask someone to add them for you. What else can I do to help prevent falls?  Wear shoes that:  Do not have high heels.  Have rubber bottoms.  Are comfortable and fit you well.  Are closed at the toe. Do not wear sandals.  If you use a stepladder:  Make sure that it is fully  opened. Do not climb a closed stepladder.  Make sure that both sides of the stepladder are locked into place.  Ask someone to hold it for you, if possible.  Clearly mark and make sure that you can see:  Any grab bars or handrails.  First and last steps.  Where the edge of each step is.  Use tools that help you move around (mobility aids) if they are needed. These include:  Canes.  Walkers.  Scooters.  Crutches.  Turn on the lights when you go into a dark area. Replace any light bulbs as soon as they burn out.  Set up your furniture so you have a clear path. Avoid moving your furniture around.  If any of your floors are uneven, fix them.  If there are any pets around you, be aware of where they are.  Review your medicines with your doctor. Some medicines can make you feel dizzy. This can increase your chance of falling. Ask your doctor what other things that you can do to help prevent falls. This information is not intended to replace advice given to you by your health care provider. Make sure you discuss any questions you have with your health care provider. Document Released: 02/27/2009 Document Revised: 10/09/2015 Document Reviewed: 06/07/2014 Elsevier Interactive Patient Education  2017 [employer].

## 2019-10-16 NOTE — Progress Notes (Signed)
Subjective:   ANNAHY Kramer is a 86 y.o. female who presents for Medicare Annual (Subsequent) preventive examination.  Review of Systems: N/A   I connected with the patient today by telephone and verified that I am speaking with the correct person using two identifiers. Location patient: home Location nurse: work Persons participating in the virtual visit: patient, Marine scientist.   I discussed the limitations, risks, security and privacy concerns of performing an evaluation and management service by telephone and the availability of in person appointments. I also discussed with the patient that there may be a patient responsible charge related to this service. The patient expressed understanding and verbally consented to this telephonic visit.    Interactive audio and video telecommunications were attempted between this nurse and patient, however failed, due to patient having technical difficulties OR patient did not have access to video capability.  We continued and completed visit with audio only.     Cardiac Risk Factors include: advanced age (>83men, >19 women);hypertension;dyslipidemia     Objective:     Vitals: There were no vitals taken for this visit.  There is no height or weight on file to calculate BMI.  Advanced Directives 10/16/2019 10/05/2018 04/21/2018 04/21/2018 09/20/2017 09/10/2016  Does Patient Have a Medical Advance Directive? Yes Yes No No Yes Yes  Type of Paramedic of [place];Living will [place];Living will - - [place];Living will [place];Living will  Copy of [place] in Chart? No - copy requested No - copy requested - - No - copy requested No - copy requested  Would patient like information on creating a medical advance directive? - - No - Patient declined No - Patient declined - -    Tobacco Social History   Tobacco Use  Smoking Status Never Smoker  Smokeless  Tobacco Never Used     Counseling given: Not Answered   Clinical Intake:  Pre-visit preparation completed: Yes  Pain : No/denies pain     Nutritional Risks: None Diabetes: No  How often do you need to have someone help you when you read instructions, pamphlets, or other written materials from your doctor or pharmacy?: 1 - Never What is the last grade level you completed in school?: 12th  Interpreter Needed?: No  Information entered by :: CJohnson, LPN  Past Medical History:  Diagnosis Date   Hyperlipidemia    Osteopenia    Past Surgical History:  Procedure Laterality Date   TONSILLECTOMY     Family History  Problem Relation Age of Onset   Stroke Mother        age 38   Emphysema Father        [employer]   Coronary artery disease Father        MI age 80   Coronary artery disease Brother        stent   Cancer Neg Hx    Social History   Socioeconomic History   Marital status: Married    Spouse name: Not on file   Number of children: Not on file   Years of education: Not on file   Highest education level: Not on file  Occupational History   Not on file  Tobacco Use   Smoking status: Never Smoker   Smokeless tobacco: Never Used  Substance and Sexual Activity   Alcohol use: No   Drug use: No   Sexual activity: Not Currently  Other Topics Concern  Not on file  Social History Narrative   Regular exercise- yes.  Going to jazz class 4 days a week, daily walk.   Diet- fruits and veggies, milk, water.   Social Determinants of Health   Financial Resource Strain: Low Risk    Difficulty of Paying Living Expenses: Not hard at all  Food Insecurity: No Food Insecurity   Worried About Charity fundraiser in the Last Year: Never true   [place] in the Last Year: Never true  Transportation Needs: No Transportation Needs   Lack of Transportation (Medical): No   Lack of Transportation (Non-Medical): No  Physical Activity:  Sufficiently Active   Days of Exercise per Week: 3 days   Minutes of Exercise per Session: 60 min  Stress: No Stress Concern Present   Feeling of Stress : Not at all  Social Connections:    Frequency of Communication with Friends and Family:    Frequency of Social Gatherings with Friends and Family:    Attends Religious Services:    Active Member of Clubs or Organizations:    Attends Archivist Meetings:    Marital Status:     Outpatient Encounter Medications as of 10/16/2019  Medication Sig   alendronate (FOSAMAX) 70 MG tablet TAKE 1 TABLET BY MOUTH EVERY 7 DAYS TAKE ON EMPTY STOMACH WITH FULL GLASS OF WATER   amLODipine (NORVASC) 5 MG tablet Take 1 tablet (5 mg total) by mouth daily. NEED OV.   apixaban (ELIQUIS) 2.5 MG TABS tablet Take 1 tablet (2.5 mg total) by mouth 2 (two) times daily.   Calcium Carbonate-Vitamin D (CALTRATE 600+D) 600-400 MG-UNIT per tablet Take 1 tablet by mouth daily.    donepezil (ARICEPT ODT) 5 MG disintegrating tablet Take 1 tablet (5 mg total) by mouth at bedtime.   hydrALAZINE (APRESOLINE) 10 MG tablet Take 1 tablet daily for systolic blood pressure 99991111   lisinopril (ZESTRIL) 20 MG tablet TAKE 1 TABLET BY MOUTH EVERY DAY   No facility-administered encounter medications on file as of 10/16/2019.    Activities of Daily Living In your present state of health, do you have any difficulty performing the following activities: 10/16/2019  Hearing? Y  Comment has hearing aids does not wear them.  Vision? N  Difficulty concentrating or making decisions? N  Walking or climbing stairs? N  Dressing or bathing? N  Doing errands, shopping? N  Preparing Food and eating ? N  Using the Toilet? N  In the past six months, have you accidently leaked urine? N  Do you have problems with loss of bowel control? N  Managing your Medications? N  Managing your Finances? N  Housekeeping or managing your Housekeeping? N  Some recent data might be  hidden    Patient Care Team: Jinny Sanders, MD as PCP - General    Assessment:   This is a routine wellness examination for Daania.  Exercise Activities and Dietary recommendations Current Exercise Habits: Home exercise routine, Type of exercise: Other - see comments, Time (Minutes): 60, Frequency (Times/Week): 3, Weekly Exercise (Minutes/Week): 180, Intensity: Moderate, Exercise limited by: None identified  Goals     Patient Stated     Starting 10/05/2018, I will continue to take medications as prescribed.      Patient Stated     10/16/2019, I will continue to do aerobics 3 days a week for 1 hour.        Fall Risk Fall Risk  10/16/2019 10/05/2018 09/20/2017 09/10/2016  07/18/2015  Falls in the past year? 0 0 No No No  Number falls in past yr: 0 - - - -  Injury with Fall? 0 - - - -  Risk for fall due to : Medication side effect - - - -  Follow up Falls evaluation completed;Falls prevention discussed - - - -   Is the patient's home free of loose throw rugs in walkways, pet beds, electrical cords, etc?   yes      Grab bars in the bathroom? yes      Handrails on the stairs?   yes      Adequate lighting?   yes  Timed Get Up and Go performed: N/A  Depression Screen PHQ 2/9 Scores 10/16/2019 10/05/2018 09/20/2017 09/10/2016  PHQ - 2 Score 0 0 0 0  PHQ- 9 Score 0 0 0 -     Cognitive Function MMSE - Mini Mental State Exam 10/16/2019 10/05/2018 09/20/2017 09/10/2016  Orientation to time 3 5 5 5   Orientation to Place 5 5 5 5   Registration 3 3 3 3   Attention/ Calculation 5 0 0 0  Recall 0 0 1 3  Recall-comments - unable to recall 3 of 3 words unable to recall 2 of 3 words -  Language- name 2 objects - 0 0 0  Language- repeat 1 1 1 1   Language- follow 3 step command - 0 3 3  Language- read & follow direction - 0 0 0  Write a sentence - 0 0 0  Copy design - 0 0 0  Total score - 14 18 20   Mini Cog  Mini-Cog screen was completed. Maximum score is 22. A value of 0 denotes this part of the MMSE  was not completed or the patient failed this part of the Mini-Cog screening.       Immunization History  Administered Date(s) Administered   Fluad Quad(high Dose 65+) 01/24/2019   Influenza Split 03/01/2012   Influenza Whole 04/28/2007, 02/20/2008, 01/28/2010   Influenza, High Dose Seasonal PF 02/20/2014, 02/15/2015, 03/10/2016, 01/12/2018   Influenza,inj,Quad PF,6+ Mos 02/01/2017   Pneumococcal Conjugate-13 07/11/2014   Pneumococcal Polysaccharide-23 04/24/2010   Td 04/28/2007   Zoster 04/24/2010    Qualifies for Shingles Vaccine: Yes   Screening Tests Health Maintenance  Topic Date Due   COVID-19 Vaccine (1) Never done   TETANUS/TDAP  05/16/2020 (Originally 04/27/2017)   MAMMOGRAM  11/28/2019   INFLUENZA VACCINE  12/16/2019   DEXA SCAN  Completed   PNA vac Low Risk Adult  Completed    Cancer Screenings: Lung: Low Dose CT Chest recommended if Age 21-80 years, 30 pack-year currently smoking OR have quit w/in 15 years. Patient does not qualify. Breast:  Up to date on Mammogram: Yes, completed 11/28/2018   Up to date of Bone Density/Dexa: Yes, completed 11/28/2018 Colorectal: no longer required  Additional Screenings:  Hepatitis C Screening: N/A     Plan:   Patient will continue to do aerobics 3 days a week for 1 hours.    I have personally reviewed and noted the following in the patients chart:    Medical and social history  Use of alcohol, tobacco or illicit drugs   Current medications and supplements  Functional ability and status  Nutritional status  Physical activity  Advanced directives  List of other physicians  Hospitalizations, surgeries, and ER visits in previous 12 months  Vitals  Screenings to include cognitive, depression, and falls  Referrals and appointments  In addition, I have  reviewed and discussed with patient certain preventive protocols, quality metrics, and best practice recommendations. A written personalized  care plan for preventive services as well as general preventive health recommendations were provided to patient.     Andrez Grime, LPN  [id number]

## 2019-10-16 NOTE — Progress Notes (Signed)
PCP notes:  Health Maintenance: No gaps noted   Abnormal Screenings: MMSE score 17   Patient concerns: none   Nurse concerns: none   Next PCP appt.: 10/25/2019 @ 11 am

## 2019-10-17 ENCOUNTER — Other Ambulatory Visit (INDEPENDENT_AMBULATORY_CARE_PROVIDER_SITE_OTHER): Payer: PPO

## 2019-10-17 ENCOUNTER — Other Ambulatory Visit: Payer: Self-pay

## 2019-10-17 DIAGNOSIS — E782 Mixed hyperlipidemia: Secondary | ICD-10-CM

## 2019-10-17 DIAGNOSIS — R7303 Prediabetes: Secondary | ICD-10-CM | POA: Diagnosis not present

## 2019-10-17 DIAGNOSIS — E559 Vitamin D deficiency, unspecified: Secondary | ICD-10-CM | POA: Diagnosis not present

## 2019-10-17 LAB — COMPREHENSIVE METABOLIC PANEL
ALT: 10 U/L (ref 0–35)
AST: 13 U/L (ref 0–37)
Albumin: 4.1 g/dL (ref 3.5–5.2)
Alkaline Phosphatase: 37 U/L — ABNORMAL LOW (ref 39–117)
BUN: 15 mg/dL (ref 6–23)
CO2: 33 mEq/L — ABNORMAL HIGH (ref 19–32)
Calcium: 9.4 mg/dL (ref 8.4–10.5)
Chloride: 96 mEq/L (ref 96–112)
Creatinine, Ser: 0.74 mg/dL (ref 0.40–1.20)
GFR: 74.14 mL/min (ref >60.00)
Glucose, Bld: 224 mg/dL — ABNORMAL HIGH (ref 70–99)
Potassium: 4.6 mEq/L (ref 3.5–5.1)
Sodium: 135 mEq/L (ref 135–145)
Total Bilirubin: 0.4 mg/dL (ref 0.2–1.2)
Total Protein: 6 g/dL (ref 6.0–8.3)

## 2019-10-17 LAB — HEMOGLOBIN A1C: Hgb A1c MFr Bld: 6.7 % — ABNORMAL HIGH (ref 4.6–6.5)

## 2019-10-17 LAB — VITAMIN D 25 HYDROXY (VIT D DEFICIENCY, FRACTURES): VITD: 21.87 ng/mL — ABNORMAL LOW (ref 30.00–100.00)

## 2019-10-19 NOTE — Progress Notes (Signed)
No critical labs need to be addressed urgently. We will discuss labs in detail at upcoming office visit.   

## 2019-10-23 ENCOUNTER — Encounter: Payer: PPO | Admitting: Family Medicine

## 2019-10-25 ENCOUNTER — Ambulatory Visit (INDEPENDENT_AMBULATORY_CARE_PROVIDER_SITE_OTHER): Payer: PPO | Admitting: Family Medicine

## 2019-10-25 ENCOUNTER — Other Ambulatory Visit: Payer: Self-pay

## 2019-10-25 ENCOUNTER — Encounter: Payer: Self-pay | Admitting: Family Medicine

## 2019-10-25 VITALS — BP 150/60 | HR 72 | Temp 98.2°F | Ht 61.0 in | Wt 108.0 lb

## 2019-10-25 DIAGNOSIS — E559 Vitamin D deficiency, unspecified: Secondary | ICD-10-CM | POA: Diagnosis not present

## 2019-10-25 DIAGNOSIS — E871 Hypo-osmolality and hyponatremia: Secondary | ICD-10-CM | POA: Diagnosis not present

## 2019-10-25 DIAGNOSIS — E785 Hyperlipidemia, unspecified: Secondary | ICD-10-CM

## 2019-10-25 DIAGNOSIS — F03B0 Unspecified dementia, moderate, without behavioral disturbance, psychotic disturbance, mood disturbance, and anxiety: Secondary | ICD-10-CM

## 2019-10-25 DIAGNOSIS — I10 Essential (primary) hypertension: Secondary | ICD-10-CM

## 2019-10-25 DIAGNOSIS — E1159 Type 2 diabetes mellitus with other circulatory complications: Secondary | ICD-10-CM | POA: Diagnosis not present

## 2019-10-25 DIAGNOSIS — Z0000 Encounter for general adult medical examination without abnormal findings: Secondary | ICD-10-CM

## 2019-10-25 DIAGNOSIS — I480 Paroxysmal atrial fibrillation: Secondary | ICD-10-CM

## 2019-10-25 DIAGNOSIS — F0390 Unspecified dementia without behavioral disturbance: Secondary | ICD-10-CM | POA: Diagnosis not present

## 2019-10-25 DIAGNOSIS — E118 Type 2 diabetes mellitus with unspecified complications: Secondary | ICD-10-CM

## 2019-10-25 DIAGNOSIS — E1169 Type 2 diabetes mellitus with other specified complication: Secondary | ICD-10-CM | POA: Diagnosis not present

## 2019-10-25 DIAGNOSIS — I152 Hypertension secondary to endocrine disorders: Secondary | ICD-10-CM

## 2019-10-25 MED ORDER — DONEPEZIL HCL 10 MG PO TBDP: 10.0000 mg | ORAL_TABLET | Freq: Every day | ORAL | 11 refills | Status: AC

## 2019-10-25 MED ORDER — VITAMIN D (ERGOCALCIFEROL) 1.25 MG (50000 UNIT) PO CAPS: 50000.0000 [IU] | ORAL_CAPSULE | ORAL | 0 refills | Status: DC

## 2019-10-25 NOTE — Assessment & Plan Note (Addendum)
Discussed patient home safety and med administration in detail with daughter over phone and patient at [facility] has also noted furtehr decline in [place]. Recommend eight home health aide or NH placement. They will look into options.    Continued decline despite aricept 5 mg daily Increase to 10 mg daily of aricept.   requested daughter to come with pt to all appoitnments.

## 2019-10-25 NOTE — Assessment & Plan Note (Signed)
Resolved off diuretics

## 2019-10-25 NOTE — Assessment & Plan Note (Signed)
New diagnosis. ? If 225 FBS not truly fasting as A1C only trending up to 6.7. Discussed with daughter and patient.. low sugar, low carb diet, but high protein...weight loss NOT intended.  Complication: vascular issue with HTN

## 2019-10-25 NOTE — Assessment & Plan Note (Signed)
Well controlled. Continue current medication.  

## 2019-10-25 NOTE — Assessment & Plan Note (Signed)
Given age not on statin.

## 2019-10-25 NOTE — Patient Instructions (Addendum)
Increase aricept to 10 mg daily.  Repeat vit D course.  Recommend COVID19 vaccine series. Work on low sugar diet but keep up with protein.  Check blood  Pressure a few times a week... Goal < 150/90.

## 2019-10-25 NOTE — Progress Notes (Signed)
Chief Complaint  Patient presents with  . Annual Exam    Part 2    History of Present Illness: HPI  The patient presents for annual medicare wellness, complete physical and review of chronic health problems. He/She also has the following acute concerns today:  The patient saw a LPN or RN for medicare wellness visit.  Prevention and wellness was reviewed in detail. Note reviewed and important notes copied below. Health Maintenance: No gaps noted   Abnormal Screenings: Mini COG score 17/22  10/25/19  Atrial Fibrillation: in sinus but at risk so Dr. Percival Spanish has her on Eliquis.   Mild dementia: Worsening per MMSE, pt her alone today.. cannot remember meds or if had visit for Medicare wellness. MINI COG copied below Mental State Exam 10/16/2019 10/05/2018 09/20/2017 09/10/2016  Orientation to time 3 5 5 5   Orientation to Place 5 5 5 5   Registration 3 3 3 3   Attention/ Calculation 5 0 0 0  Recall 0 0 1 3  Recall-comments - unable to recall 3 of 3 words unable to recall 2 of 3 words -  Language- name 2 objects - 0 0 0  Language- repeat 1 1 1 1   Language- follow 3 step command - 0 3 3  Language- read & follow direction - 0 0 0  Write a sentence - 0 0 0  Copy design - 0 0 0  Total score - 14 18 20   Mini Cog  Mini-Cog screen was completed. Maximum score is 22. A value of 0 denotes this part of the MMSE was not completed or the patient failed this part of the Mini-Cog screening.  On aricept 5 mg daily... continued decline. Na normal , off sedating meds.  She lives in her own, by herself.  Has daughter Manuela Schwartz.. lives neighboring town. CALLED SUSAN TEAGUE, discussed patient care for 15 minutes: SHE ALSO HAS CONCERNS ABOUT HER MEMORY AND HOME SAFETY.  Vit D still low at 21. A1C good.. but recent FBS with labs 224 ( pt may have not been fasting)... now  New DX diabetes Lab Results  Component Value Date   HGBA1C 6.7 (H) 10/17/2019   Elevated Cholesterol:  Lab Results   Component Value Date   CHOL 185 10/11/2018   HDL 69.50 10/11/2018   LDLCALC 100 (H) 10/11/2018   TRIG 77.0 10/11/2018   CHOLHDL 3 10/11/2018  Using medications without problems: Muscle aches:  Diet compliance: good Exercise: walk the dog Other complaints:  Hypertension:    Tolerable control for age on amlodipine, lisinopril and hydralazine. BP Readings from Last 3 Encounters:  10/25/19 (!) 150/60  04/17/19 (!) 140/58  10/12/18 (!) 150/60  Using medication without problems or lightheadedness: none  Chest pain with exertion:none Edema:none Short of breath:none Average home BPs: Other issues:    This visit occurred during the SARS-CoV-2 public health emergency.  Safety protocols were in place, including screening questions prior to the visit, additional usage of staff PPE, and extensive cleaning of exam room while observing appropriate contact time as indicated for disinfecting solutions.   COVID 19 screen:  No recent travel or known exposure to COVID19 The patient denies respiratory symptoms of COVID 19 at this time. The importance of social distancing was discussed today.     Review of Systems  Constitutional: Negative for chills and fever.  HENT: Negative for congestion and ear pain.   Eyes: Negative for pain and redness.  Respiratory: Negative for cough and shortness of breath.   Cardiovascular: Negative  for chest pain, palpitations and leg swelling.  Gastrointestinal: Negative for abdominal pain, blood in stool, constipation, diarrhea, nausea and vomiting.  Genitourinary: Negative for dysuria.  Musculoskeletal: Negative for falls and myalgias.  Skin: Negative for rash.  Neurological: Negative for dizziness.  Psychiatric/Behavioral: Positive for memory loss. Negative for depression. The patient is not nervous/anxious.       Past Medical History:  Diagnosis Date  . Hyperlipidemia   . Osteopenia     reports that she has never smoked. She has never used smokeless  tobacco. She reports that she does not drink alcohol and does not use drugs.   Current Outpatient Medications:  .  alendronate (FOSAMAX) 70 MG tablet, TAKE 1 TABLET BY MOUTH EVERY 7 DAYS TAKE ON EMPTY STOMACH WITH FULL GLASS OF WATER, Disp: 12 tablet, Rfl: 1 .  amLODipine (NORVASC) 5 MG tablet, TAKE 1 TABLET (5 MG TOTAL) BY MOUTH DAILY. NEED OV., Disp: 90 tablet, Rfl: 0 .  apixaban (ELIQUIS) 2.5 MG TABS tablet, Take 1 tablet (2.5 mg total) by mouth 2 (two) times daily., Disp: 60 tablet, Rfl: 11 .  Calcium Carbonate-Vitamin D (CALTRATE 600+D) 600-400 MG-UNIT per tablet, Take 1 tablet by mouth daily. , Disp: , Rfl:  .  donepezil (ARICEPT ODT) 5 MG disintegrating tablet, Take 1 tablet (5 mg total) by mouth at bedtime., Disp: 30 tablet, Rfl: 11 .  hydrALAZINE (APRESOLINE) 10 MG tablet, Take 1 tablet daily for systolic blood pressure >725, Disp: 30 tablet, Rfl: 5 .  lisinopril (ZESTRIL) 20 MG tablet, TAKE 1 TABLET BY MOUTH EVERY DAY, Disp: 90 tablet, Rfl: 1   Observations/Objective: Blood pressure (!) 150/60, pulse 72, temperature 98.2 F (36.8 C), temperature source Temporal, height 5\' 1"  (1.549 m), weight 108 lb (49 kg), SpO2 98 %.  Physical Exam Constitutional:      General: She is not in acute distress.    Appearance: Normal appearance. She is well-developed. She is not ill-appearing or toxic-appearing.  HENT:     Head: Normocephalic.     Right Ear: Hearing, tympanic membrane, ear canal and external ear normal. Tympanic membrane is not erythematous, retracted or bulging.     Left Ear: Hearing, tympanic membrane, ear canal and external ear normal. Tympanic membrane is not erythematous, retracted or bulging.     Nose: No mucosal edema or rhinorrhea.     Right Sinus: No maxillary sinus tenderness or frontal sinus tenderness.     Left Sinus: No maxillary sinus tenderness or frontal sinus tenderness.     Mouth/Throat:     Pharynx: Uvula midline.  Eyes:     General: Lids are normal. Lids are  everted, no foreign bodies appreciated.     Conjunctiva/sclera: Conjunctivae normal.     Pupils: Pupils are equal, round, and reactive to light.  Neck:     Thyroid: No thyroid mass or thyromegaly.     Vascular: No carotid bruit.     Trachea: Trachea normal.  Cardiovascular:     Rate and Rhythm: Normal rate and regular rhythm.     Pulses: Normal pulses.     Heart sounds: Normal heart sounds, S1 normal and S2 normal. No murmur heard.  No friction rub. No gallop.   Pulmonary:     Effort: Pulmonary effort is normal. No tachypnea or respiratory distress.     Breath sounds: Normal breath sounds. No decreased breath sounds, wheezing, rhonchi or rales.  Abdominal:     General: Bowel sounds are normal.     Palpations: Abdomen  is soft.     Tenderness: There is no abdominal tenderness.  Musculoskeletal:     Cervical back: Normal range of motion and neck supple.  Skin:    General: Skin is warm and dry.     Findings: No rash.  Neurological:     Mental Status: She is alert and oriented to person, place, and time.     GCS: GCS eye subscore is 4. GCS verbal subscore is 5. GCS motor subscore is 6.     Cranial Nerves: No cranial nerve deficit.     Sensory: No sensory deficit.     Motor: No abnormal muscle tone.     Coordination: Coordination normal.     Gait: Gait normal.     Deep Tendon Reflexes: Reflexes are normal and symmetric.     Comments: Nml cerebellar exam   No papilledema  Psychiatric:        Mood and Affect: Mood and affect normal. Mood is not anxious or depressed.        Speech: Speech normal.        Behavior: Behavior normal. Behavior is cooperative.        Thought Content: Thought content normal. Thought content is not delusional.        Cognition and Memory: Memory is impaired. She exhibits impaired recent memory. She does not exhibit impaired remote memory.        Judgment: Judgment normal.      Assessment and Plan The patient's preventative maintenance and recommended  screening tests for an annual wellness exam were reviewed in full today. Brought up to date unless services declined.  Counselled on the importance of diet, exercise, and its role in overall health and mortality. The patient's FH and SH was reviewed, including their home life, tobacco status, and drug and alcohol status.     Vaccines: uptodate,   Discussed COVID19 vaccine side effects and benefits. Strongly encouraged the patient to get the vaccine. Questions answered. DEXA: Osteoporosis on fosamax. Mammo: 07/14/2015,not indicated Colon: 07/2005 Dr. Fuller Plan...adenomatous polyps, Not indicated  Pap/DVE: not indicated  Nonsmoker  Moderate dementia without behavioral disturbance ([place]) Discussed patient home safety and med administration in detail with daughter over phone and patient at [facility].Daughetr has also noted furtehr decline in [place]. Recommend eight home health aide or NH placement. They will look into options.    Continued decline despite aricept 5 mg daily Increase to 10 mg daily of aricept.   requested daughter to come with pt to all appoitnments.  Paroxysmal atrial fibrillation ([facility]) CHA2DS2 - VASc score of 4  On anticoagulation with Eliquis.  Hypertension associated with diabetes ([place]) Well controlled. Continue current medication.   Controlled diabetes mellitus type 2 with complications ([place]) New diagnosis. ? If 225 FBS not truly fasting as A1C only trending up to 6.7. Discussed with daughter and patient.. low sugar, low carb diet, but high protein...weight loss NOT intended.  Complication: vascular issue with HTN   Hyponatremia Resolved off diuretics  Hyperlipidemia associated with type 2 diabetes mellitus [facility]) Given age not on statin.  Vitamin D deficiency Replace vit D.. this may help with cognition some.    Eliezer Lofts, MD

## 2019-10-25 NOTE — Assessment & Plan Note (Signed)
Replace vit D.. this may help with cognition some.

## 2019-10-25 NOTE — Assessment & Plan Note (Signed)
CHA2DS2 - VASc score of 4  On anticoagulation with Eliquis.

## 2019-11-09 ENCOUNTER — Other Ambulatory Visit: Payer: Self-pay | Admitting: Family Medicine

## 2019-11-22 NOTE — Progress Notes (Signed)
Cardiology Office Note   Date:  11/23/2019   ID:  Meredith Kramer, DOB 1929-10-07, MRN [id number]  PCP:  Jinny Sanders, MD  Cardiologist:   Minus Breeding, MD Referring:  Jinny Sanders, MD  Chief Complaint  Patient presents with  . Hypertension      History of Present Illness: Meredith Kramer is a 86 y.o. female who was referred by Jinny Sanders, MD for evaluation of hypertensive urgency.  She has had atrial fib.  Since I last saw her she has had no new cardiac problems.  There has been some difficulty with her remembering to take her medications apparently.  She has not had any cardiac complaints however.  Her blood pressures probably at home after talking to her daughter on the phone during this visit are likely to be in the 962I systolic routinely.  There is been no documented paroxysmal atrial fibrillation.  There have certainly been no symptoms of hypertensive urgency but she did not have any symptoms with her fibrillation or hypertension before.  She is not having any presyncope or syncope.  She is not having any chest pressure, neck or arm discomfort.  She denies shortness of breath, PND or orthopnea.   Past Medical History:  Diagnosis Date  . HTN (hypertension)   . Hyperlipidemia   . Osteopenia   . PAF (paroxysmal atrial fibrillation) ([place])     Past Surgical History:  Procedure Laterality Date  . TONSILLECTOMY       Current Outpatient Medications  Medication Sig Dispense Refill  . alendronate (FOSAMAX) 70 MG tablet TAKE 1 TABLET BY MOUTH EVERY 7 DAYS TAKE ON EMPTY STOMACH WITH FULL GLASS OF WATER 12 tablet 1  . amLODipine (NORVASC) 5 MG tablet Take 1.5 tablets (7.5 mg total) by mouth daily. 45 tablet 11  . apixaban (ELIQUIS) 2.5 MG TABS tablet Take 1 tablet (2.5 mg total) by mouth 2 (two) times daily. 60 tablet 11  . Calcium Carbonate-Vitamin D (CALTRATE 600+D) 600-400 MG-UNIT per tablet Take 1 tablet by mouth daily.     Marland Kitchen donepezil (ARICEPT ODT) 10 MG  disintegrating tablet Take 1 tablet (10 mg total) by mouth at bedtime. 30 tablet 11  . hydrALAZINE (APRESOLINE) 10 MG tablet Take 1 tablet daily for systolic blood pressure >297 30 tablet 5  . lisinopril (ZESTRIL) 20 MG tablet TAKE 1 TABLET BY MOUTH EVERY DAY 90 tablet 1  . Vitamin D, Ergocalciferol, (DRISDOL) 1.25 MG (50000 UNIT) CAPS capsule Take 1 capsule (50,000 Units total) by mouth every 7 (seven) days. 12 capsule 0   No current facility-administered medications for this visit.    Allergies:   Doxycycline hyclate, Erythromycin base, and Penicillins    ROS:  Please see the history of present illness.   Otherwise, review of systems are positive for decreased memory.   All other systems are reviewed and negative.    PHYSICAL EXAM: VS:  BP 140/62   Pulse 72   Temp (!) 97.3 F (36.3 C)   Ht 5\' 2"  (1.575 m)   Wt 108 lb 12.8 oz (49.4 kg)   SpO2 91%   BMI 19.90 kg/m  , BMI Body mass index is 19.9 kg/m. GENERAL:  Well appearing NECK:  No jugular venous distention, waveform within normal limits, carotid upstroke brisk and symmetric, no bruits, no thyromegaly LUNGS:  Clear to auscultation bilaterally CHEST:  Unremarkable HEART:  PMI not displaced or sustained,S1 and S2 within normal limits, no S3, no S4, no  clicks, no rubs, no murmurs ABD:  Flat, positive bowel sounds normal in frequency in pitch, no bruits, no rebound, no guarding, no midline pulsatile mass, no hepatomegaly, no splenomegaly EXT:  2 plus pulses throughout, no edema, no cyanosis no clubbing    EKG:  EKG is  ordered today. Sinus rhythm, rate 72, premature atrial contractions, no acute ST-T wave changes.  Recent Labs: 10/17/2019: ALT 10; BUN 15; Creatinine, Ser 0.74; Potassium 4.6; Sodium 135    Lipid Panel    Component Value Date/Time   CHOL 185 10/11/2018 1232   TRIG 77.0 10/11/2018 1232   HDL 69.50 10/11/2018 1232   CHOLHDL 3 10/11/2018 1232   VLDL 15.4 10/11/2018 1232   LDLCALC 100 (H) 10/11/2018 1232       Wt Readings from Last 3 Encounters:  11/23/19 108 lb 12.8 oz (49.4 kg)  10/25/19 108 lb (49 kg)  04/17/19 111 lb 8 oz (50.6 kg)      Other studies Reviewed: Additional studies/ records that were reviewed today include: Labs Review of the above records demonstrates:  See elsewhere   ASSESSMENT AND PLAN:  HTN:   Her blood pressure is not at target.  I will increase her amlodipine to 7.5 mg daily.  ATRIAL FIB:  Ms. Meredith Kramer has a CHA2DS2 - VASc score of 4.  Assuming that she is having paroxysms we have chosen to continue her Eliquis.  She is not having any bleeding issues.  She is on the appropriate dose for her age and size.  I will check a CBC today.   COVID EDUCATION: She has had dose one of the Pfizer vaccine.  Current medicines are reviewed at length with the patient today.  The patient does not have concerns regarding medicines.  The following changes have been made: As above  Labs/ tests ordered today include: As above  Orders Placed This Encounter  Procedures  . CBC  . EKG 12-Lead     Disposition:   FU with 12 months with me.   Signed, Minus Breeding, MD  11/23/2019 10:34 AM    [facility]

## 2019-11-23 ENCOUNTER — Encounter: Payer: Self-pay | Admitting: Cardiology

## 2019-11-23 ENCOUNTER — Ambulatory Visit: Payer: PPO | Admitting: Cardiology

## 2019-11-23 ENCOUNTER — Other Ambulatory Visit: Payer: Self-pay

## 2019-11-23 VITALS — BP 140/62 | HR 72 | Temp 97.3°F | Ht 62.0 in | Wt 108.8 lb

## 2019-11-23 DIAGNOSIS — I480 Paroxysmal atrial fibrillation: Secondary | ICD-10-CM | POA: Diagnosis not present

## 2019-11-23 DIAGNOSIS — Z7189 Other specified counseling: Secondary | ICD-10-CM

## 2019-11-23 DIAGNOSIS — I10 Essential (primary) hypertension: Secondary | ICD-10-CM

## 2019-11-23 LAB — CBC
Hematocrit: 37.5 % (ref 34.0–46.6)
Hemoglobin: 12.5 g/dL (ref 11.1–15.9)
MCH: 29.5 pg (ref 26.6–33.0)
MCHC: 33.3 g/dL (ref 31.5–35.7)
MCV: 88 fL (ref 79–97)
Platelets: 223 10*3/uL (ref 150–450)
RBC: 4.24 x10E6/uL (ref 3.77–5.28)
RDW: 12.5 % (ref 11.7–15.4)
WBC: 4.6 10*3/uL (ref 3.4–10.8)

## 2019-11-23 MED ORDER — AMLODIPINE BESYLATE 5 MG PO TABS: 7.5000 mg | ORAL_TABLET | Freq: Every day | ORAL | 11 refills | Status: AC

## 2019-11-23 NOTE — Patient Instructions (Addendum)
Medication Instructions:  INCREASE the Amlodipine (Norvasc) to 7.5 mg (one and a half of the 5mg  tablets) once daily  *If you need a refill on your cardiac medications before your next appointment, please call your pharmacy*   Lab Work: Your provider would like for you to have the following labs today: CBC  If you have labs (blood work) drawn today and your tests are completely normal, you will receive your results only by: Marland Kitchen MyChart Message (if you have MyChart) OR . A paper copy in the mail If you have any lab test that is abnormal or we need to change your treatment, we will call you to review the results.   Testing/Procedures: None ordered   Follow-Up: At [facility], you and your health needs are our priority.  As part of our continuing mission to provide you with exceptional heart care, we have created designated Provider Care Teams.  These Care Teams include your primary Cardiologist (physician) and Advanced Practice Providers (APPs -  Physician Assistants and Nurse Practitioners) who all work together to provide you with the care you need, when you need it.  We recommend signing up for the patient portal called "MyChart".  Sign up information is provided on this After Visit Summary.  MyChart is used to connect with patients for Virtual Visits (Telemedicine).  Patients are able to view lab/test results, encounter notes, upcoming appointments, etc.  Non-urgent messages can be sent to your provider as well.   To learn more about what you can do with MyChart, go to [email or web address].    Your next appointment:   12 month(s)  The format for your next appointment:   In Person  Provider:   You may see Dr. Percival Spanish or one of the following Advanced Practice Providers on your designated Care Team:    Rosaria Ferries, PA-C  Jory Sims, DNP, ANP  Cadence Kathlen Mody, PA-C

## 2019-12-04 DIAGNOSIS — Z1231 Encounter for screening mammogram for malignant neoplasm of breast: Secondary | ICD-10-CM | POA: Diagnosis not present

## 2019-12-04 LAB — HM MAMMOGRAPHY

## 2019-12-10 ENCOUNTER — Encounter: Payer: Self-pay | Admitting: Family Medicine

## 2019-12-14 ENCOUNTER — Other Ambulatory Visit: Payer: Self-pay | Admitting: Family Medicine

## 2019-12-14 ENCOUNTER — Other Ambulatory Visit: Payer: Self-pay | Admitting: Cardiology

## 2019-12-14 DIAGNOSIS — I480 Paroxysmal atrial fibrillation: Secondary | ICD-10-CM

## 2020-01-25 ENCOUNTER — Ambulatory Visit: Payer: PPO | Admitting: Family Medicine

## 2020-01-31 DIAGNOSIS — Z6820 Body mass index (BMI) 20.0-20.9, adult: Secondary | ICD-10-CM | POA: Diagnosis not present

## 2020-01-31 DIAGNOSIS — I480 Paroxysmal atrial fibrillation: Secondary | ICD-10-CM | POA: Diagnosis not present

## 2020-01-31 DIAGNOSIS — Z85820 Personal history of malignant melanoma of skin: Secondary | ICD-10-CM | POA: Diagnosis not present

## 2020-01-31 DIAGNOSIS — M810 Age-related osteoporosis without current pathological fracture: Secondary | ICD-10-CM | POA: Diagnosis not present

## 2020-01-31 DIAGNOSIS — Z23 Encounter for immunization: Secondary | ICD-10-CM | POA: Diagnosis not present

## 2020-01-31 DIAGNOSIS — E1122 Type 2 diabetes mellitus with diabetic chronic kidney disease: Secondary | ICD-10-CM | POA: Diagnosis not present

## 2020-01-31 DIAGNOSIS — D6869 Other thrombophilia: Secondary | ICD-10-CM | POA: Diagnosis not present

## 2020-01-31 DIAGNOSIS — E1142 Type 2 diabetes mellitus with diabetic polyneuropathy: Secondary | ICD-10-CM | POA: Diagnosis not present

## 2020-01-31 DIAGNOSIS — F0390 Unspecified dementia without behavioral disturbance: Secondary | ICD-10-CM | POA: Diagnosis not present

## 2020-01-31 DIAGNOSIS — E1159 Type 2 diabetes mellitus with other circulatory complications: Secondary | ICD-10-CM | POA: Diagnosis not present

## 2020-01-31 DIAGNOSIS — I129 Hypertensive chronic kidney disease with stage 1 through stage 4 chronic kidney disease, or unspecified chronic kidney disease: Secondary | ICD-10-CM | POA: Diagnosis not present

## 2020-01-31 DIAGNOSIS — E559 Vitamin D deficiency, unspecified: Secondary | ICD-10-CM | POA: Diagnosis not present

## 2020-02-25 DIAGNOSIS — D2261 Melanocytic nevi of right upper limb, including shoulder: Secondary | ICD-10-CM | POA: Diagnosis not present

## 2020-02-25 DIAGNOSIS — Z85820 Personal history of malignant melanoma of skin: Secondary | ICD-10-CM | POA: Diagnosis not present

## 2020-02-25 DIAGNOSIS — D2262 Melanocytic nevi of left upper limb, including shoulder: Secondary | ICD-10-CM | POA: Diagnosis not present

## 2020-02-25 DIAGNOSIS — D2272 Melanocytic nevi of left lower limb, including hip: Secondary | ICD-10-CM | POA: Diagnosis not present

## 2020-02-25 DIAGNOSIS — Z85828 Personal history of other malignant neoplasm of skin: Secondary | ICD-10-CM | POA: Diagnosis not present

## 2020-02-25 DIAGNOSIS — C44519 Basal cell carcinoma of skin of other part of trunk: Secondary | ICD-10-CM | POA: Diagnosis not present

## 2020-02-25 DIAGNOSIS — D485 Neoplasm of uncertain behavior of skin: Secondary | ICD-10-CM | POA: Diagnosis not present

## 2020-02-25 DIAGNOSIS — L821 Other seborrheic keratosis: Secondary | ICD-10-CM | POA: Diagnosis not present

## 2020-02-25 DIAGNOSIS — D0462 Carcinoma in situ of skin of left upper limb, including shoulder: Secondary | ICD-10-CM | POA: Diagnosis not present

## 2020-02-25 DIAGNOSIS — D225 Melanocytic nevi of trunk: Secondary | ICD-10-CM | POA: Diagnosis not present

## 2020-03-09 ENCOUNTER — Other Ambulatory Visit: Payer: Self-pay | Admitting: Family Medicine

## 2020-03-10 NOTE — Telephone Encounter (Signed)
Last office visit 10/25/2019 for CPE.  Last refilled 10/25/2019 for #12 with no refills.  Last Vit D level 21.87 ng/ml on 10/17/2019.  No future appointments with PCP.

## 2020-03-28 DIAGNOSIS — D0462 Carcinoma in situ of skin of left upper limb, including shoulder: Secondary | ICD-10-CM | POA: Diagnosis not present

## 2020-03-28 DIAGNOSIS — C44519 Basal cell carcinoma of skin of other part of trunk: Secondary | ICD-10-CM | POA: Diagnosis not present

## 2020-05-01 DIAGNOSIS — I480 Paroxysmal atrial fibrillation: Secondary | ICD-10-CM | POA: Diagnosis not present

## 2020-05-01 DIAGNOSIS — F0390 Unspecified dementia without behavioral disturbance: Secondary | ICD-10-CM | POA: Diagnosis not present

## 2020-05-01 DIAGNOSIS — I129 Hypertensive chronic kidney disease with stage 1 through stage 4 chronic kidney disease, or unspecified chronic kidney disease: Secondary | ICD-10-CM | POA: Diagnosis not present

## 2020-05-01 DIAGNOSIS — E1122 Type 2 diabetes mellitus with diabetic chronic kidney disease: Secondary | ICD-10-CM | POA: Diagnosis not present

## 2020-05-01 DIAGNOSIS — M810 Age-related osteoporosis without current pathological fracture: Secondary | ICD-10-CM | POA: Diagnosis not present

## 2020-07-14 DIAGNOSIS — L538 Other specified erythematous conditions: Secondary | ICD-10-CM | POA: Diagnosis not present

## 2020-07-14 DIAGNOSIS — L820 Inflamed seborrheic keratosis: Secondary | ICD-10-CM | POA: Diagnosis not present

## 2020-09-04 DIAGNOSIS — D6869 Other thrombophilia: Secondary | ICD-10-CM | POA: Diagnosis not present

## 2020-09-04 DIAGNOSIS — E1122 Type 2 diabetes mellitus with diabetic chronic kidney disease: Secondary | ICD-10-CM | POA: Diagnosis not present

## 2020-09-04 DIAGNOSIS — M810 Age-related osteoporosis without current pathological fracture: Secondary | ICD-10-CM | POA: Diagnosis not present

## 2020-09-04 DIAGNOSIS — I480 Paroxysmal atrial fibrillation: Secondary | ICD-10-CM | POA: Diagnosis not present

## 2020-09-04 DIAGNOSIS — F0390 Unspecified dementia without behavioral disturbance: Secondary | ICD-10-CM | POA: Diagnosis not present

## 2020-09-04 DIAGNOSIS — I129 Hypertensive chronic kidney disease with stage 1 through stage 4 chronic kidney disease, or unspecified chronic kidney disease: Secondary | ICD-10-CM | POA: Diagnosis not present

## 2020-09-04 DIAGNOSIS — E1142 Type 2 diabetes mellitus with diabetic polyneuropathy: Secondary | ICD-10-CM | POA: Diagnosis not present

## 2020-10-08 ENCOUNTER — Other Ambulatory Visit (HOSPITAL_COMMUNITY): Payer: Self-pay | Admitting: *Deleted

## 2020-10-09 ENCOUNTER — Ambulatory Visit (HOSPITAL_COMMUNITY)
Admission: RE | Admit: 2020-10-09 | Discharge: 2020-10-09 | Disposition: A | Discharge status: Home / Self Care | Payer: PPO | Source: Ambulatory Visit | Attending: Internal Medicine | Admitting: Internal Medicine

## 2020-10-09 ENCOUNTER — Other Ambulatory Visit: Payer: Self-pay

## 2020-10-09 DIAGNOSIS — M810 Age-related osteoporosis without current pathological fracture: Secondary | ICD-10-CM | POA: Insufficient documentation

## 2020-10-09 MED ORDER — DENOSUMAB 60 MG/ML ~~LOC~~ SOSY: 60.0000 mg | PREFILLED_SYRINGE | Freq: Once | SUBCUTANEOUS | Status: AC

## 2020-10-09 MED ORDER — DENOSUMAB 60 MG/ML ~~LOC~~ SOSY
PREFILLED_SYRINGE | SUBCUTANEOUS | Status: AC
  Administered 2020-10-09: 60 mg via SUBCUTANEOUS
  Filled 2020-10-09: qty 1

## 2020-10-09 NOTE — Discharge Instructions (Signed)
Denosumab injection What is this medicine? DENOSUMAB (den oh sue mab) slows bone breakdown. Prolia is used to treat osteoporosis in women after menopause and in men, and in people who are taking corticosteroids for 6 months or more. Xgeva is used to treat a high calcium level due to cancer and to prevent bone fractures and other bone problems caused by multiple myeloma or cancer bone metastases. Xgeva is also used to treat giant cell tumor of the bone. This medicine may be used for other purposes; ask your health care provider or pharmacist if you have questions. COMMON BRAND NAME(S): Prolia, XGEVA What should I tell my health care provider before I take this medicine? They need to know if you have any of these conditions:  dental disease  having surgery or tooth extraction  infection  kidney disease  low levels of calcium or Vitamin D in the blood  malnutrition  on hemodialysis  skin conditions or sensitivity  thyroid or parathyroid disease  an unusual reaction to denosumab, other medicines, foods, dyes, or preservatives  pregnant or trying to get pregnant  breast-feeding How should I use this medicine? This medicine is for injection under the skin. It is given by a health care professional in a hospital or clinic setting. A special MedGuide will be given to you before each treatment. Be sure to read this information carefully each time. For Prolia, talk to your pediatrician regarding the use of this medicine in children. Special care may be needed. For Xgeva, talk to your pediatrician regarding the use of this medicine in children. While this drug may be prescribed for children as young as 13 years for selected conditions, precautions do apply. Overdosage: If you think you have taken too much of this medicine contact a poison control center or emergency room at once. NOTE: This medicine is only for you. Do not share this medicine with others. What if I miss a dose? It is  important not to miss your dose. Call your doctor or health care professional if you are unable to keep an appointment. What may interact with this medicine? Do not take this medicine with any of the following medications:  other medicines containing denosumab This medicine may also interact with the following medications:  medicines that lower your chance of fighting infection  steroid medicines like prednisone or cortisone This list may not describe all possible interactions. Give your health care provider a list of all the medicines, herbs, non-prescription drugs, or dietary supplements you use. Also tell them if you smoke, drink alcohol, or use illegal drugs. Some items may interact with your medicine. What should I watch for while using this medicine? Visit your doctor or health care professional for regular checks on your progress. Your doctor or health care professional may order blood tests and other tests to see how you are doing. Call your doctor or health care professional for advice if you get a fever, chills or sore throat, or other symptoms of a cold or flu. Do not treat yourself. This drug may decrease your body's ability to fight infection. Try to avoid being around people who are sick. You should make sure you get enough calcium and vitamin D while you are taking this medicine, unless your doctor tells you not to. Discuss the foods you eat and the vitamins you take with your health care professional. See your dentist regularly. Brush and floss your teeth as directed. Before you have any dental work done, tell your dentist you are   receiving this medicine. Do not become pregnant while taking this medicine or for 5 months after stopping it. Talk with your doctor or health care professional about your birth control options while taking this medicine. Women should inform their doctor if they wish to become pregnant or think they might be pregnant. There is a potential for serious side  effects to an unborn child. Talk to your health care professional or pharmacist for more information. What side effects may I notice from receiving this medicine? Side effects that you should report to your doctor or health care professional as soon as possible:  allergic reactions like skin rash, itching or hives, swelling of the face, lips, or tongue  bone pain  breathing problems  dizziness  jaw pain, especially after dental work  redness, blistering, peeling of the skin  signs and symptoms of infection like fever or chills; cough; sore throat; pain or trouble passing urine  signs of low calcium like fast heartbeat, muscle cramps or muscle pain; pain, tingling, numbness in the hands or feet; seizures  unusual bleeding or bruising  unusually weak or tired Side effects that usually do not require medical attention (report to your doctor or health care professional if they continue or are bothersome):  constipation  diarrhea  headache  joint pain  loss of appetite  muscle pain  runny nose  tiredness  upset stomach This list may not describe all possible side effects. Call your doctor for medical advice about side effects. You may report side effects to FDA at 1-800-FDA-1088. Where should I keep my medicine? This medicine is only given in a clinic, doctor's office, or other health care setting and will not be stored at home. NOTE: This sheet is a summary. It may not cover all possible information. If you have questions about this medicine, talk to your doctor, pharmacist, or health care provider.  2021 Elsevier/Gold Standard (2017-09-09 16:10:44)

## 2020-10-16 ENCOUNTER — Ambulatory Visit: Payer: PPO

## 2020-11-04 ENCOUNTER — Ambulatory Visit: Payer: PPO

## 2020-11-10 ENCOUNTER — Other Ambulatory Visit: Payer: Self-pay

## 2020-11-10 ENCOUNTER — Ambulatory Visit (INDEPENDENT_AMBULATORY_CARE_PROVIDER_SITE_OTHER): Payer: PPO

## 2020-11-10 DIAGNOSIS — Z0000 Encounter for general adult medical examination without abnormal findings: Secondary | ICD-10-CM

## 2020-11-10 NOTE — Progress Notes (Signed)
PCP notes:  Health Maintenance: Shingrix- due Covid- due Eye exam- due    Abnormal Screenings: MMSE -unable to recall words   Patient concerns: none   Nurse concerns: none   Next PCP appt.: none

## 2020-11-10 NOTE — Patient Instructions (Signed)
Meredith Kramer , Thank you for taking time to come for your Medicare Wellness Visit. I appreciate your ongoing commitment to your health goals. Please review the following plan we discussed and let me know if I can assist you in the future.   Screening recommendations/referrals: Colonoscopy: no  longer required  Mammogram: Up to date, completed 12/04/2019, due 11/2020 Bone Density: Up to date, completed 11/28/2018, due 11/2020 Recommended yearly ophthalmology/optometry visit for glaucoma screening and checkup Recommended yearly dental visit for hygiene and checkup  Vaccinations: Influenza vaccine: Up to date, completed 01/31/2020, due 12/2020 Pneumococcal vaccine: Completed series Tdap vaccine: decline-insurance Shingles vaccine: due, check with your insurance regarding coverage if interested    Covid-19:Completed 1 vaccine, Please check your records to see if anymore vaccines were administered.   Advanced directives: Please bring a copy of your POA (Power of Attorney) and/or Living Will to your next appointment.    Conditions/risks identified:  diabetes, hypertension, hyperlipidemia   Next appointment: Follow up in one year for your annual wellness visit    Preventive Care 76 Years and Older, Female Preventive care refers to lifestyle choices and visits with your health care provider that can promote health and wellness. What does preventive care include? A yearly physical exam. This is also called an annual well check. Dental exams once or twice a year. Routine eye exams. Ask your health care provider how often you should have your eyes checked. Personal lifestyle choices, including: Daily care of your teeth and gums. Regular physical activity. Eating a healthy diet. Avoiding tobacco and drug use. Limiting alcohol use. Practicing safe sex. Taking low-dose aspirin every day. Taking vitamin and mineral supplements as recommended by your health care provider. What happens during an  annual well check? The services and screenings done by your health care provider during your annual well check will depend on your age, overall health, lifestyle risk factors, and family history of disease. Counseling  Your health care provider may ask you questions about your: Alcohol use. Tobacco use. Drug use. Emotional well-being. Home and relationship well-being. Sexual activity. Eating habits. History of falls. Memory and ability to understand (cognition). Work and work Statistician. Reproductive health. Screening  You may have the following tests or measurements: Height, weight, and BMI. Blood pressure. Lipid and cholesterol levels. These may be checked every 5 years, or more frequently if you are over 69 years old. Skin check. Lung cancer screening. You may have this screening every year starting at age 31 if you have a 30-pack-year history of smoking and currently smoke or have quit within the past 15 years. Fecal occult blood test (FOBT) of the stool. You may have this test every year starting at age 95. Flexible sigmoidoscopy or colonoscopy. You may have a sigmoidoscopy every 5 years or a colonoscopy every 10 years starting at age 69. Hepatitis C blood test. Hepatitis B blood test. Sexually transmitted disease (STD) testing. Diabetes screening. This is done by checking your blood sugar (glucose) after you have not eaten for a while (fasting). You may have this done every 1-3 years. Bone density scan. This is done to screen for osteoporosis. You may have this done starting at age 10. Mammogram. This may be done every 1-2 years. Talk to your health care provider about how often you should have regular mammograms. Talk with your health care provider about your test results, treatment options, and if necessary, the need for more tests. Vaccines  Your health care provider may recommend certain vaccines, such as:  Influenza vaccine. This is recommended every year. Tetanus,  diphtheria, and acellular pertussis (Tdap, Td) vaccine. You may need a Td booster every 10 years. Zoster vaccine. You may need this after age 14. Pneumococcal 13-valent conjugate (PCV13) vaccine. One dose is recommended after age 55. Pneumococcal polysaccharide (PPSV23) vaccine. One dose is recommended after age 28. Talk to your health care provider about which screenings and vaccines you need and how often you need them. This information is not intended to replace advice given to you by your health care provider. Make sure you discuss any questions you have with your health care provider. Document Released: 05/30/2015 Document Revised: 01/21/2016 Document Reviewed: 03/04/2015 Elsevier Interactive Patient Education  2017 [place] Prevention in the Home Falls can cause injuries. They can happen to people of all ages. There are many things you can do to make your home safe and to help prevent falls. What can I do on the outside of my home? Regularly fix the edges of walkways and driveways and fix any cracks. Remove anything that might make you trip as you walk through a door, such as a raised step or threshold. Trim any bushes or trees on the path to your home. Use bright outdoor lighting. Clear any walking paths of anything that might make someone trip, such as rocks or tools. Regularly check to see if handrails are loose or broken. Make sure that both sides of any steps have handrails. Any raised decks and porches should have guardrails on the edges. Have any leaves, snow, or ice cleared regularly. Use sand or salt on walking paths during winter. Clean up any spills in your garage right away. This includes oil or grease spills. What can I do in the bathroom? Use night lights. Install grab bars by the toilet and in the tub and shower. Do not use towel bars as grab bars. Use non-skid mats or decals in the tub or shower. If you need to sit down in the shower, use a plastic,  non-slip stool. Keep the floor dry. Clean up any water that spills on the floor as soon as it happens. Remove soap buildup in the tub or shower regularly. Attach bath mats securely with double-sided non-slip rug tape. Do not have throw rugs and other things on the floor that can make you trip. What can I do in the bedroom? Use night lights. Make sure that you have a light by your bed that is easy to reach. Do not use any sheets or blankets that are too big for your bed. They should not hang down onto the floor. Have a firm chair that has side arms. You can use this for support while you get dressed. Do not have throw rugs and other things on the floor that can make you trip. What can I do in the kitchen? Clean up any spills right away. Avoid walking on wet floors. Keep items that you use a lot in easy-to-reach places. If you need to reach something above you, use a strong step stool that has a grab bar. Keep electrical cords out of the way. Do not use floor polish or wax that makes floors slippery. If you must use wax, use non-skid floor wax. Do not have throw rugs and other things on the floor that can make you trip. What can I do with my stairs? Do not leave any items on the stairs. Make sure that there are handrails on both sides of the stairs and use them.  Fix handrails that are broken or loose. Make sure that handrails are as long as the stairways. Check any carpeting to make sure that it is firmly attached to the stairs. Fix any carpet that is loose or worn. Avoid having throw rugs at the top or bottom of the stairs. If you do have throw rugs, attach them to the floor with carpet tape. Make sure that you have a light switch at the top of the stairs and the bottom of the stairs. If you do not have them, ask someone to add them for you. What else can I do to help prevent falls? Wear shoes that: Do not have high heels. Have rubber bottoms. Are comfortable and fit you well. Are closed  at the toe. Do not wear sandals. If you use a stepladder: Make sure that it is fully opened. Do not climb a closed stepladder. Make sure that both sides of the stepladder are locked into place. Ask someone to hold it for you, if possible. Clearly mark and make sure that you can see: Any grab bars or handrails. First and last steps. Where the edge of each step is. Use tools that help you move around (mobility aids) if they are needed. These include: Canes. Walkers. Scooters. Crutches. Turn on the lights when you go into a dark area. Replace any light bulbs as soon as they burn out. Set up your furniture so you have a clear path. Avoid moving your furniture around. If any of your floors are uneven, fix them. If there are any pets around you, be aware of where they are. Review your medicines with your doctor. Some medicines can make you feel dizzy. This can increase your chance of falling. Ask your doctor what other things that you can do to help prevent falls. This information is not intended to replace advice given to you by your health care provider. Make sure you discuss any questions you have with your health care provider. Document Released: 02/27/2009 Document Revised: 10/09/2015 Document Reviewed: 06/07/2014 Elsevier Interactive Patient Education  2017 [employer].

## 2020-11-10 NOTE — Progress Notes (Addendum)
Subjective:   Meredith Kramer is a 87 y.o. female who presents for Medicare Annual (Subsequent) preventive examination.  Review of Systems: N/A      I connected with the patient today by telephone and verified that I am speaking with the correct person using two identifiers. Location patient: home Location nurse: work Persons participating in the telephone visit: patient, nurse.   I discussed the limitations, risks, security and privacy concerns of performing an evaluation and management service by telephone and the availability of in person appointments. I also discussed with the patient that there may be a patient responsible charge related to this service. The patient expressed understanding and verbally consented to this telephonic visit.        Cardiac Risk Factors include: advanced age (>59men, >38 women);diabetes mellitus;hypertension;Other (see comment), Risk factor comments: hyperlipidemia     Objective:    Today's Vitals   There is no height or weight on file to calculate BMI.  Advanced Directives 11/10/2020 10/16/2019 10/05/2018 04/21/2018 04/21/2018 09/20/2017 09/10/2016  Does Patient Have a Medical Advance Directive? Yes Yes Yes No No Yes Yes  Type of Paramedic of [place];Living will [place];Living will [place];Living will - - [place];Living will [place];Living will  Copy of [place] in Chart? No - copy requested No - copy requested No - copy requested - - No - copy requested No - copy requested  Would patient like information on creating a medical advance directive? - - - No - Patient declined No - Patient declined - -    Current Medications (verified) Outpatient Encounter Medications as of 11/10/2020  Medication Sig   alendronate (FOSAMAX) 70 MG tablet TAKE 1 TABLET BY MOUTH EVERY 7 DAYS TAKE ON EMPTY STOMACH WITH FULL GLASS OF WATER    amLODipine (NORVASC) 5 MG tablet Take 1.5 tablets (7.5 mg total) by mouth daily.   Calcium Carbonate-Vitamin D 600-400 MG-UNIT tablet Take 1 tablet by mouth daily.    donepezil (ARICEPT ODT) 10 MG disintegrating tablet Take 1 tablet (10 mg total) by mouth at bedtime.   ELIQUIS 2.5 MG TABS tablet TAKE 1 TABLET BY MOUTH TWICE A DAY   hydrALAZINE (APRESOLINE) 10 MG tablet Take 1 tablet daily for systolic blood pressure >409   lisinopril (ZESTRIL) 20 MG tablet TAKE 1 TABLET BY MOUTH EVERY DAY   Vitamin D, Ergocalciferol, (DRISDOL) 1.25 MG (50000 UNIT) CAPS capsule TAKE 1 CAPSULE (50,000 UNITS TOTAL) BY MOUTH EVERY 7 (SEVEN) DAYS.   No facility-administered encounter medications on file as of 11/10/2020.    Allergies (verified) Doxycycline hyclate, Erythromycin base, and Penicillins   History: Past Medical History:  Diagnosis Date   HTN (hypertension)    Hyperlipidemia    Osteopenia    PAF (paroxysmal atrial fibrillation) (HCC)    Past Surgical History:  Procedure Laterality Date   TONSILLECTOMY     Family History  Problem Relation Age of Onset   Stroke Mother        age 37   Emphysema Father        [employer]   Coronary artery disease Father        MI age 64   Coronary artery disease Brother        stent   Cancer Neg Hx    Social History   Socioeconomic History   Marital status: Married    Spouse name: Not on file   Number  of children: Not on file   Years of education: Not on file   Highest education level: Not on file  Occupational History   Not on file  Tobacco Use   Smoking status: Never   Smokeless tobacco: Never  Vaping Use   Vaping Use: Never used  Substance and Sexual Activity   Alcohol use: No   Drug use: No   Sexual activity: Not Currently  Other Topics Concern   Not on file  Social History Narrative   Regular exercise- yes.  Going to jazz class 4 days a week, daily walk.   Diet- fruits and veggies, milk, water.   Social Determinants of Health    Financial Resource Strain: Low Risk    Difficulty of Paying Living Expenses: Not hard at all  Food Insecurity: No Food Insecurity   Worried About Charity fundraiser in the Last Year: Never true   [place] in the Last Year: Never true  Transportation Needs: No Transportation Needs   Lack of Transportation (Medical): No   Lack of Transportation (Non-Medical): No  Physical Activity: Inactive   Days of Exercise per Week: 0 days   Minutes of Exercise per Session: 0 min  Stress: No Stress Concern Present   Feeling of Stress : Not at all  Social Connections: Not on file    Tobacco Counseling Counseling given: Not Answered   Clinical Intake:  Pre-visit preparation completed: Yes  Pain : No/denies pain     Nutritional Risks: None Diabetes: Yes CBG done?: No Did pt. bring in CBG monitor from home?: No  How often do you need to have someone help you when you read instructions, pamphlets, or other written materials from your doctor or pharmacy?: 1 - Never What is the last grade level you completed in school?: 12th, some college courses  Diabetic: Yes Nutrition Risk Assessment:  Has the patient had any N/V/D within the last 2 months?  No  Does the patient have any non-healing wounds?  No  Has the patient had any unintentional weight loss or weight gain?  No   Diabetes:  Is the patient diabetic?  Yes  If diabetic, was a CBG obtained today?  No  telephone visit  Did the patient bring in their glucometer from home?  No  telephone visit How often do you monitor your CBG's? never.   Financial Strains and Diabetes Management:  Are you having any financial strains with the device, your supplies or your medication? No .  Does the patient want to be seen by Chronic Care Management for management of their diabetes?  No  Would the patient like to be referred to a Nutritionist or for Diabetic Management?  No   Diabetic Exams:  Diabetic Eye Exam: Overdue for diabetic eye  exam. Pt has been advised about the importance in completing this exam. Patient advised to call and schedule an eye exam. Diabetic Foot Exam: Completed 01/31/2020   Interpreter Needed?: No  Information entered by :: [place], LPN   Activities of Daily Living In your present state of health, do you have any difficulty performing the following activities: 11/10/2020  Hearing? N  Vision? N  Difficulty concentrating or making decisions? N  Walking or climbing stairs? N  Dressing or bathing? N  Doing errands, shopping? N  Preparing Food and eating ? N  Using the Toilet? N  In the past six months, have you accidently leaked urine? N  Do you have problems with loss of  bowel control? N  Managing your Medications? N  Managing your Finances? N  Housekeeping or managing your Housekeeping? N  Some recent data might be hidden    Patient Care Team: Jinny Sanders, MD as PCP - General Minus Breeding, MD as PCP - Cardiology (Cardiology)  Indicate any recent Medical Services you may have received from other than [facility] providers in the past year (date may be approximate).     Assessment:   This is a routine wellness examination for Yovanna.  Hearing/Vision screen Vision Screening - Comments:: Patient gets annual eye exams   Dietary issues and exercise activities discussed: Current Exercise Habits: The patient does not participate in regular exercise at present, Exercise limited by: None identified   Goals Addressed             This Visit's Progress    Patient Stated       11/10/2020, I maintain and continue medications as prescribed.        Depression Screen PHQ 2/9 Scores 11/10/2020 10/16/2019 10/05/2018 09/20/2017 09/10/2016 07/18/2015 07/11/2014  PHQ - 2 Score 0 0 0 0 0 0 0  PHQ- 9 Score 0 0 0 0 - - -    Fall Risk Fall Risk  11/10/2020 10/16/2019 10/05/2018 09/20/2017 09/10/2016  Falls in the past year? 0 0 0 No No  Number falls in past yr: 0 0 - - -  Injury with Fall? 0 0 - - -  Risk  for fall due to : Medication side effect Medication side effect - - -  Follow up Falls evaluation completed;Falls prevention discussed Falls evaluation completed;Falls prevention discussed - - -    FALL RISK PREVENTION PERTAINING TO THE HOME:  Any stairs in or around the home? Yes  If so, are there any without handrails? No  Home free of loose throw rugs in walkways, pet beds, electrical cords, etc? Yes  Adequate lighting in your home to reduce risk of falls? Yes   ASSISTIVE DEVICES UTILIZED TO PREVENT FALLS:  Life alert? No  Use of a cane, walker or w/c? No  Grab bars in the bathroom? No  Shower chair or bench in shower? No  Elevated toilet seat or a handicapped toilet? No   TIMED UP AND GO:  Was the test performed?  N/A telephone visit .   Cognitive Function: MMSE - Mini Mental State Exam 11/10/2020 10/16/2019 10/05/2018 09/20/2017 09/10/2016  Orientation to time 5 3 5 5 5   Orientation to Place 5 5 5 5 5   Registration 3 3 3 3 3   Attention/ Calculation 5 5 0 0 0  Recall 0 0 0 1 3  Recall-comments - - unable to recall 3 of 3 words unable to recall 2 of 3 words -  Language- name 2 objects - - 0 0 0  Language- repeat 1 1 1 1 1   Language- follow 3 step command - - 0 3 3  Language- read & follow direction - - 0 0 0  Write a sentence - - 0 0 0  Copy design - - 0 0 0  Total score - - 14 18 20   Mini Cog  Mini-Cog screen was completed. Maximum score is 22. A value of 0 denotes this part of the MMSE was not completed or the patient failed this part of the Mini-Cog screening.       Immunizations Immunization History  Administered Date(s) Administered   Fluad Quad(high Dose 65+) 01/24/2019   Influenza Split 03/01/2012   Influenza  Whole 04/28/2007, 02/20/2008, 01/28/2010   Influenza, High Dose Seasonal PF 02/20/2014, 02/15/2015, 03/10/2016, 01/12/2018   Influenza, Quadrivalent, Recombinant, Inj, Pf 01/31/2020   Influenza,inj,Quad PF,6+ Mos 02/01/2017   PFIZER(Purple Top)SARS-COV-2  Vaccination 11/20/2019   Pneumococcal Conjugate-13 07/11/2014   Pneumococcal Polysaccharide-23 04/24/2010, 04/24/2010, 01/31/2020   Td 04/28/2007   Td (Adult) 04/28/2007   Zoster, Live 04/24/2010    TDAP status: Due, Education has been provided regarding the importance of this vaccine. Advised may receive this vaccine at local pharmacy or Health Dept. Aware to provide a copy of the vaccination record if obtained from local pharmacy or Health Dept. Verbalized acceptance and understanding.  Flu Vaccine status: Up to date  Pneumococcal vaccine status: Up to date  Covid-19 vaccine status: Completed 1 vaccine, Patient states she can't remember if she had anymore vaccines administered.  Qualifies for Shingles Vaccine? Yes   Zostavax completed Yes   Shingrix Completed?: No.    Education has been provided regarding the importance of this vaccine. Patient has been advised to call insurance company to determine out of pocket expense if they have not yet received this vaccine. Advised may also receive vaccine at local pharmacy or Health Dept. Verbalized acceptance and understanding.  Screening Tests Health Maintenance  Topic Date Due   OPHTHALMOLOGY EXAM  Never done   Zoster Vaccines- Shingrix (1 of 2) Never done   TETANUS/TDAP  04/27/2017   COVID-19 Vaccine (2 - Pfizer risk series) 12/11/2019   HEMOGLOBIN A1C  04/17/2020   MAMMOGRAM  12/03/2020   INFLUENZA VACCINE  12/15/2020   FOOT EXAM  01/30/2021   DEXA SCAN  Completed   PNA vac Low Risk Adult  Completed   HPV VACCINES  Aged Out    Health Maintenance  Health Maintenance Due  Topic Date Due   OPHTHALMOLOGY EXAM  Never done   Zoster Vaccines- Shingrix (1 of 2) Never done   TETANUS/TDAP  04/27/2017   COVID-19 Vaccine (2 - Pfizer risk series) 12/11/2019   HEMOGLOBIN A1C  04/17/2020    Colorectal cancer screening: No longer required.   Mammogram status: Completed 12/04/2019. Repeat every year  Bone Density status: Completed  11/28/2018. Results reflect: Bone density results: OSTEOPOROSIS. Repeat every 2 years.  Lung Cancer Screening: (Low Dose CT Chest recommended if Age 84-80 years, 30 pack-year currently smoking OR have quit w/in 15years.) does not qualify.   Additional Screening:  Hepatitis C Screening: does not qualify; Completed N/A  Vision Screening: Recommended annual ophthalmology exams for early detection of glaucoma and other disorders of the eye. Is the patient up to date with their annual eye exam?  Yes  Who is the provider or what is the name of the office in which the patient attends annual eye exams? Can't remember the name of doctor If pt is not established with a provider, would they like to be referred to a provider to establish care? No .   Dental Screening: Recommended annual dental exams for proper oral hygiene  Community Resource Referral / Chronic Care Management: CRR required this visit?  No   CCM required this visit?  No      Plan:     I have personally reviewed and noted the following in the patient's chart:   Medical and social history Use of alcohol, tobacco or illicit drugs  Current medications and supplements including opioid prescriptions.  Functional ability and status Nutritional status Physical activity Advanced directives List of other physicians Hospitalizations, surgeries, and ER visits in previous 12 months Vitals  Screenings to include cognitive, depression, and falls Referrals and appointments  In addition, I have reviewed and discussed with patient certain preventive protocols, quality metrics, and best practice recommendations. A written personalized care plan for preventive services as well as general preventive health recommendations were provided to patient.   Due to this being a telephonic visit, the after visit summary with patients personalized plan was offered to patient via office or my-chart. Patient preferred to pick up at office at next visit or  via mychart.   Andrez Grime, LPN   [id number]

## 2020-11-21 DIAGNOSIS — Z85828 Personal history of other malignant neoplasm of skin: Secondary | ICD-10-CM | POA: Diagnosis not present

## 2020-11-21 DIAGNOSIS — L820 Inflamed seborrheic keratosis: Secondary | ICD-10-CM | POA: Diagnosis not present

## 2020-11-21 DIAGNOSIS — D485 Neoplasm of uncertain behavior of skin: Secondary | ICD-10-CM | POA: Diagnosis not present

## 2020-11-21 DIAGNOSIS — Z85820 Personal history of malignant melanoma of skin: Secondary | ICD-10-CM | POA: Diagnosis not present

## 2020-11-21 DIAGNOSIS — Z86006 Personal history of melanoma in-situ: Secondary | ICD-10-CM | POA: Diagnosis not present

## 2020-11-21 DIAGNOSIS — L821 Other seborrheic keratosis: Secondary | ICD-10-CM | POA: Diagnosis not present

## 2020-11-21 DIAGNOSIS — L538 Other specified erythematous conditions: Secondary | ICD-10-CM | POA: Diagnosis not present

## 2020-11-21 DIAGNOSIS — D2261 Melanocytic nevi of right upper limb, including shoulder: Secondary | ICD-10-CM | POA: Diagnosis not present

## 2020-12-05 DIAGNOSIS — E1122 Type 2 diabetes mellitus with diabetic chronic kidney disease: Secondary | ICD-10-CM | POA: Diagnosis not present

## 2020-12-05 DIAGNOSIS — E559 Vitamin D deficiency, unspecified: Secondary | ICD-10-CM | POA: Diagnosis not present

## 2020-12-05 DIAGNOSIS — I129 Hypertensive chronic kidney disease with stage 1 through stage 4 chronic kidney disease, or unspecified chronic kidney disease: Secondary | ICD-10-CM | POA: Diagnosis not present

## 2020-12-08 ENCOUNTER — Other Ambulatory Visit: Payer: Self-pay | Admitting: Internal Medicine

## 2020-12-08 DIAGNOSIS — Z1231 Encounter for screening mammogram for malignant neoplasm of breast: Secondary | ICD-10-CM

## 2020-12-12 ENCOUNTER — Ambulatory Visit
Admission: RE | Admit: 2020-12-12 | Discharge: 2020-12-12 | Disposition: A | Discharge status: Home / Self Care | Payer: PPO | Source: Ambulatory Visit | Attending: Internal Medicine | Admitting: Internal Medicine

## 2020-12-12 ENCOUNTER — Other Ambulatory Visit: Payer: Self-pay

## 2020-12-12 DIAGNOSIS — D6869 Other thrombophilia: Secondary | ICD-10-CM | POA: Diagnosis not present

## 2020-12-12 DIAGNOSIS — Z1231 Encounter for screening mammogram for malignant neoplasm of breast: Secondary | ICD-10-CM

## 2020-12-12 DIAGNOSIS — I480 Paroxysmal atrial fibrillation: Secondary | ICD-10-CM | POA: Diagnosis not present

## 2020-12-12 DIAGNOSIS — Z0000 Encounter for general adult medical examination without abnormal findings: Secondary | ICD-10-CM | POA: Diagnosis not present

## 2020-12-12 DIAGNOSIS — E559 Vitamin D deficiency, unspecified: Secondary | ICD-10-CM | POA: Diagnosis not present

## 2020-12-12 DIAGNOSIS — F0390 Unspecified dementia without behavioral disturbance: Secondary | ICD-10-CM | POA: Diagnosis not present

## 2020-12-12 DIAGNOSIS — E1122 Type 2 diabetes mellitus with diabetic chronic kidney disease: Secondary | ICD-10-CM | POA: Diagnosis not present

## 2020-12-12 DIAGNOSIS — Z1339 Encounter for screening examination for other mental health and behavioral disorders: Secondary | ICD-10-CM | POA: Diagnosis not present

## 2020-12-12 DIAGNOSIS — I129 Hypertensive chronic kidney disease with stage 1 through stage 4 chronic kidney disease, or unspecified chronic kidney disease: Secondary | ICD-10-CM | POA: Diagnosis not present

## 2020-12-12 DIAGNOSIS — M810 Age-related osteoporosis without current pathological fracture: Secondary | ICD-10-CM | POA: Diagnosis not present

## 2020-12-12 DIAGNOSIS — Z1331 Encounter for screening for depression: Secondary | ICD-10-CM | POA: Diagnosis not present

## 2020-12-18 ENCOUNTER — Other Ambulatory Visit: Payer: Self-pay | Admitting: Internal Medicine

## 2020-12-18 DIAGNOSIS — R928 Other abnormal and inconclusive findings on diagnostic imaging of breast: Secondary | ICD-10-CM

## 2021-01-01 NOTE — Progress Notes (Signed)
Cardiology Office Note   Date:  01/02/2021   ID:  Meredith Kramer 03-17-1930, MRN [id number]  PCP:  Michael Boston, MD  Cardiologist:   Minus Breeding, MD Referring:  Michael Boston, MD  Chief Complaint  Patient presents with   Atrial Fibrillation       History of Present Illness: Meredith Kramer is a 87 y.o. female who was referred by Michael Boston, MD for evaluation of hypertensive urgency.  She has had atrial fib.  Since I last saw her she has had no new cardiac problems.  At the last appointment I increased her amlodipine.  She has done well and she has not had any complaints but she has increasing memory problems.  She is with her daughter and her daughter is frustrated because the patient refuses to move into assisted living or allow help in the home and lives with her daughter all which have been options.  She is taking her meds using a pill packet and that seems to work for the most part.  There is been no obvious palpitations.  They are not really doing the blood pressure any longer because of the memory problems.  She is taking her blood thinners and there have been no blleeding or contraindication.  She is not having any falls.   Past Medical History:  Diagnosis Date   HTN (hypertension)    Hyperlipidemia    Osteopenia    PAF (paroxysmal atrial fibrillation) (HCC)     Past Surgical History:  Procedure Laterality Date   TONSILLECTOMY       Current Outpatient Medications  Medication Sig Dispense Refill   alendronate (FOSAMAX) 70 MG tablet TAKE 1 TABLET BY MOUTH EVERY 7 DAYS TAKE ON EMPTY STOMACH WITH FULL GLASS OF WATER 12 tablet 3   amLODipine (NORVASC) 5 MG tablet Take 1.5 tablets (7.5 mg total) by mouth daily. 45 tablet 11   Calcium Carbonate-Vitamin D 600-400 MG-UNIT tablet Take 1 tablet by mouth daily.      donepezil (ARICEPT ODT) 10 MG disintegrating tablet Take 1 tablet (10 mg total) by mouth at bedtime. 30 tablet 11   ELIQUIS 2.5 MG TABS tablet TAKE  1 TABLET BY MOUTH TWICE A DAY 180 tablet 1   hydrALAZINE (APRESOLINE) 10 MG tablet Take 1 tablet daily for systolic blood pressure 99991111 30 tablet 5   lisinopril (ZESTRIL) 20 MG tablet TAKE 1 TABLET BY MOUTH EVERY DAY 90 tablet 1   Vitamin D, Ergocalciferol, (DRISDOL) 1.25 MG (50000 UNIT) CAPS capsule TAKE 1 CAPSULE (50,000 UNITS TOTAL) BY MOUTH EVERY 7 (SEVEN) DAYS. 4 capsule 2   No current facility-administered medications for this visit.    Allergies:   Doxycycline hyclate, Erythromycin base, and Penicillins    ROS:  Please see the history of present illness.   Otherwise, review of systems are positive for memory problems.   All other systems are reviewed and negative.    PHYSICAL EXAM: VS:  BP 123/72   Pulse 67   Ht 5' (1.524 m)   Wt 104 lb 3.2 oz (47.3 kg)   SpO2 96%   BMI 20.35 kg/m  , BMI Body mass index is 20.35 kg/m. GENERAL:  Well appearing NECK:  No jugular venous distention, waveform within normal limits, carotid upstroke brisk and symmetric, no bruits, no thyromegaly LUNGS:  Clear to auscultation bilaterally CHEST:  Unremarkable HEART:  PMI not displaced or sustained,S1 and S2 within normal limits, no S3, no S4, no  clicks, no rubs, no murmurs ABD:  Flat, positive bowel sounds normal in frequency in pitch, no bruits, no rebound, no guarding, no midline pulsatile mass, no hepatomegaly, no splenomegaly EXT:  2 plus pulses throughout, no edema, no cyanosis no clubbing   EKG:  EKG is  ordered today. Sinus rhythm, rate 67, premature atrial contractions, no acute ST-T wave changes.  Recent Labs: No results found for requested labs within last 8760 hours.    Lipid Panel    Component Value Date/Time   CHOL 185 10/11/2018 1232   TRIG 77.0 10/11/2018 1232   HDL 69.50 10/11/2018 1232   CHOLHDL 3 10/11/2018 1232   VLDL 15.4 10/11/2018 1232   LDLCALC 100 (H) 10/11/2018 1232      Wt Readings from Last 3 Encounters:  01/02/21 104 lb 3.2 oz (47.3 kg)  11/23/19 108 lb  12.8 oz (49.4 kg)  10/25/19 108 lb (49 kg)      Other studies Reviewed: Additional studies/ records that were reviewed today include: None Review of the above records demonstrates:  NA   ASSESSMENT AND PLAN:  HTN:   Her blood pressure is at target.  No change in therapy.   ATRIAL FIB:  Ms. Meredith Kramer has a CHA2DS2 - VASc score of 4.  She had previously had palpitations.  Now it is hard to assess symptoms because of her memory.  She is not having any contraindication anticoagulation.  Her daughters can make sure she gets her blood work checked twice a year by her primary physician.  She will continue on the meds as listed.   Current medicines are reviewed at length with the patient today.  The patient does not have concerns regarding medicines.  The following changes have been made: As above  Labs/ tests ordered today include: As above  Orders Placed This Encounter  Procedures   EKG 12-Lead      Disposition:   FU with 18 months with me.   Signed, Minus Breeding, MD  01/02/2021 10:30 AM    [facility]

## 2021-01-02 ENCOUNTER — Other Ambulatory Visit: Payer: Self-pay

## 2021-01-02 ENCOUNTER — Ambulatory Visit: Payer: PPO | Admitting: Cardiology

## 2021-01-02 ENCOUNTER — Encounter: Payer: Self-pay | Admitting: Cardiology

## 2021-01-02 VITALS — BP 123/72 | HR 67 | Ht 60.0 in | Wt 104.2 lb

## 2021-01-02 DIAGNOSIS — I10 Essential (primary) hypertension: Secondary | ICD-10-CM | POA: Diagnosis not present

## 2021-01-02 DIAGNOSIS — I480 Paroxysmal atrial fibrillation: Secondary | ICD-10-CM

## 2021-01-02 NOTE — Patient Instructions (Signed)
  Follow-Up: At [facility], you and your health needs are our priority.  As part of our continuing mission to provide you with exceptional heart care, we have created designated Provider Care Teams.  These Care Teams include your primary Cardiologist (physician) and Advanced Practice Providers (APPs -  Physician Assistants and Nurse Practitioners) who all work together to provide you with the care you need, when you need it.  We recommend signing up for the patient portal called "MyChart".  Sign up information is provided on this After Visit Summary.  MyChart is used to connect with patients for Virtual Visits (Telemedicine).  Patients are able to view lab/test results, encounter notes, upcoming appointments, etc.  Non-urgent messages can be sent to your provider as well.   To learn more about what you can do with MyChart, go to [email or web address].    Your next appointment:   18 month(s)  The format for your next appointment:   In Person  Provider:   Minus Breeding, MD

## 2021-01-07 ENCOUNTER — Ambulatory Visit
Admission: RE | Admit: 2021-01-07 | Discharge: 2021-01-07 | Disposition: A | Discharge status: Home / Self Care | Payer: PPO | Source: Ambulatory Visit | Attending: Internal Medicine | Admitting: Internal Medicine

## 2021-01-07 ENCOUNTER — Other Ambulatory Visit: Payer: Self-pay

## 2021-01-07 ENCOUNTER — Other Ambulatory Visit: Payer: Self-pay | Admitting: Internal Medicine

## 2021-01-07 DIAGNOSIS — R928 Other abnormal and inconclusive findings on diagnostic imaging of breast: Secondary | ICD-10-CM

## 2021-01-07 DIAGNOSIS — R922 Inconclusive mammogram: Secondary | ICD-10-CM | POA: Diagnosis not present

## 2021-06-17 DIAGNOSIS — E1122 Type 2 diabetes mellitus with diabetic chronic kidney disease: Secondary | ICD-10-CM | POA: Diagnosis not present

## 2021-06-17 DIAGNOSIS — D6869 Other thrombophilia: Secondary | ICD-10-CM | POA: Diagnosis not present

## 2021-06-17 DIAGNOSIS — E1142 Type 2 diabetes mellitus with diabetic polyneuropathy: Secondary | ICD-10-CM | POA: Diagnosis not present

## 2021-06-17 DIAGNOSIS — I480 Paroxysmal atrial fibrillation: Secondary | ICD-10-CM | POA: Diagnosis not present

## 2021-06-17 DIAGNOSIS — F0390 Unspecified dementia without behavioral disturbance: Secondary | ICD-10-CM | POA: Diagnosis not present

## 2021-06-17 DIAGNOSIS — I129 Hypertensive chronic kidney disease with stage 1 through stage 4 chronic kidney disease, or unspecified chronic kidney disease: Secondary | ICD-10-CM | POA: Diagnosis not present

## 2021-06-17 DIAGNOSIS — M810 Age-related osteoporosis without current pathological fracture: Secondary | ICD-10-CM | POA: Diagnosis not present

## 2021-07-02 ENCOUNTER — Other Ambulatory Visit (HOSPITAL_COMMUNITY): Payer: Self-pay

## 2021-07-03 ENCOUNTER — Ambulatory Visit (HOSPITAL_COMMUNITY)
Admission: RE | Admit: 2021-07-03 | Discharge: 2021-07-03 | Disposition: A | Discharge status: Home / Self Care | Payer: PPO | Source: Ambulatory Visit | Attending: Internal Medicine | Admitting: Internal Medicine

## 2021-07-03 ENCOUNTER — Other Ambulatory Visit: Payer: Self-pay

## 2021-07-03 DIAGNOSIS — M810 Age-related osteoporosis without current pathological fracture: Secondary | ICD-10-CM | POA: Diagnosis not present

## 2021-07-03 MED ORDER — DENOSUMAB 60 MG/ML ~~LOC~~ SOSY
PREFILLED_SYRINGE | SUBCUTANEOUS | Status: AC
  Filled 2021-07-03: qty 1

## 2021-07-03 MED ORDER — DENOSUMAB 60 MG/ML ~~LOC~~ SOSY
60.0000 mg | PREFILLED_SYRINGE | Freq: Once | SUBCUTANEOUS | Status: AC
  Administered 2021-07-03: 60 mg via SUBCUTANEOUS

## 2021-07-13 ENCOUNTER — Ambulatory Visit
Admission: RE | Admit: 2021-07-13 | Discharge: 2021-07-13 | Disposition: A | Discharge status: Home / Self Care | Payer: PPO | Source: Ambulatory Visit | Attending: Internal Medicine | Admitting: Internal Medicine

## 2021-07-13 ENCOUNTER — Other Ambulatory Visit: Payer: Self-pay

## 2021-07-13 ENCOUNTER — Other Ambulatory Visit: Payer: Self-pay | Admitting: Internal Medicine

## 2021-07-13 DIAGNOSIS — N6489 Other specified disorders of breast: Secondary | ICD-10-CM

## 2021-07-13 DIAGNOSIS — R922 Inconclusive mammogram: Secondary | ICD-10-CM | POA: Diagnosis not present

## 2021-07-13 DIAGNOSIS — R928 Other abnormal and inconclusive findings on diagnostic imaging of breast: Secondary | ICD-10-CM

## 2021-10-14 DIAGNOSIS — E559 Vitamin D deficiency, unspecified: Secondary | ICD-10-CM | POA: Diagnosis not present

## 2021-10-14 DIAGNOSIS — I129 Hypertensive chronic kidney disease with stage 1 through stage 4 chronic kidney disease, or unspecified chronic kidney disease: Secondary | ICD-10-CM | POA: Diagnosis not present

## 2021-10-14 DIAGNOSIS — I480 Paroxysmal atrial fibrillation: Secondary | ICD-10-CM | POA: Diagnosis not present

## 2021-10-14 DIAGNOSIS — E1159 Type 2 diabetes mellitus with other circulatory complications: Secondary | ICD-10-CM | POA: Diagnosis not present

## 2021-11-11 ENCOUNTER — Ambulatory Visit: Payer: PPO

## 2021-11-13 DIAGNOSIS — I480 Paroxysmal atrial fibrillation: Secondary | ICD-10-CM | POA: Diagnosis not present

## 2021-11-13 DIAGNOSIS — I129 Hypertensive chronic kidney disease with stage 1 through stage 4 chronic kidney disease, or unspecified chronic kidney disease: Secondary | ICD-10-CM | POA: Diagnosis not present

## 2021-11-13 DIAGNOSIS — E1159 Type 2 diabetes mellitus with other circulatory complications: Secondary | ICD-10-CM | POA: Diagnosis not present

## 2021-11-13 DIAGNOSIS — E559 Vitamin D deficiency, unspecified: Secondary | ICD-10-CM | POA: Diagnosis not present

## 2021-11-23 DIAGNOSIS — L821 Other seborrheic keratosis: Secondary | ICD-10-CM | POA: Diagnosis not present

## 2021-11-23 DIAGNOSIS — Z85820 Personal history of malignant melanoma of skin: Secondary | ICD-10-CM | POA: Diagnosis not present

## 2021-11-23 DIAGNOSIS — Z85828 Personal history of other malignant neoplasm of skin: Secondary | ICD-10-CM | POA: Diagnosis not present

## 2021-11-23 DIAGNOSIS — Z86006 Personal history of melanoma in-situ: Secondary | ICD-10-CM | POA: Diagnosis not present

## 2021-11-23 DIAGNOSIS — D2272 Melanocytic nevi of left lower limb, including hip: Secondary | ICD-10-CM | POA: Diagnosis not present

## 2021-12-09 DIAGNOSIS — R7989 Other specified abnormal findings of blood chemistry: Secondary | ICD-10-CM | POA: Diagnosis not present

## 2021-12-09 DIAGNOSIS — I129 Hypertensive chronic kidney disease with stage 1 through stage 4 chronic kidney disease, or unspecified chronic kidney disease: Secondary | ICD-10-CM | POA: Diagnosis not present

## 2021-12-09 DIAGNOSIS — R739 Hyperglycemia, unspecified: Secondary | ICD-10-CM | POA: Diagnosis not present

## 2021-12-09 DIAGNOSIS — E559 Vitamin D deficiency, unspecified: Secondary | ICD-10-CM | POA: Diagnosis not present

## 2021-12-16 DIAGNOSIS — Z0000 Encounter for general adult medical examination without abnormal findings: Secondary | ICD-10-CM | POA: Diagnosis not present

## 2021-12-16 DIAGNOSIS — I129 Hypertensive chronic kidney disease with stage 1 through stage 4 chronic kidney disease, or unspecified chronic kidney disease: Secondary | ICD-10-CM | POA: Diagnosis not present

## 2021-12-16 DIAGNOSIS — E559 Vitamin D deficiency, unspecified: Secondary | ICD-10-CM | POA: Diagnosis not present

## 2021-12-16 DIAGNOSIS — M810 Age-related osteoporosis without current pathological fracture: Secondary | ICD-10-CM | POA: Diagnosis not present

## 2021-12-16 DIAGNOSIS — Z1339 Encounter for screening examination for other mental health and behavioral disorders: Secondary | ICD-10-CM | POA: Diagnosis not present

## 2021-12-16 DIAGNOSIS — Z681 Body mass index (BMI) 19 or less, adult: Secondary | ICD-10-CM | POA: Diagnosis not present

## 2021-12-16 DIAGNOSIS — D6869 Other thrombophilia: Secondary | ICD-10-CM | POA: Diagnosis not present

## 2021-12-16 DIAGNOSIS — I480 Paroxysmal atrial fibrillation: Secondary | ICD-10-CM | POA: Diagnosis not present

## 2021-12-16 DIAGNOSIS — Z1331 Encounter for screening for depression: Secondary | ICD-10-CM | POA: Diagnosis not present

## 2021-12-16 DIAGNOSIS — F0390 Unspecified dementia without behavioral disturbance: Secondary | ICD-10-CM | POA: Diagnosis not present

## 2021-12-16 DIAGNOSIS — E1122 Type 2 diabetes mellitus with diabetic chronic kidney disease: Secondary | ICD-10-CM | POA: Diagnosis not present

## 2022-06-23 DIAGNOSIS — I129 Hypertensive chronic kidney disease with stage 1 through stage 4 chronic kidney disease, or unspecified chronic kidney disease: Secondary | ICD-10-CM | POA: Diagnosis not present

## 2022-06-23 DIAGNOSIS — F0390 Unspecified dementia without behavioral disturbance: Secondary | ICD-10-CM | POA: Diagnosis not present

## 2022-06-23 DIAGNOSIS — D6869 Other thrombophilia: Secondary | ICD-10-CM | POA: Diagnosis not present

## 2022-06-23 DIAGNOSIS — I480 Paroxysmal atrial fibrillation: Secondary | ICD-10-CM | POA: Diagnosis not present

## 2022-06-23 DIAGNOSIS — M810 Age-related osteoporosis without current pathological fracture: Secondary | ICD-10-CM | POA: Diagnosis not present

## 2022-06-23 DIAGNOSIS — E1122 Type 2 diabetes mellitus with diabetic chronic kidney disease: Secondary | ICD-10-CM | POA: Diagnosis not present

## 2022-07-10 DIAGNOSIS — M810 Age-related osteoporosis without current pathological fracture: Secondary | ICD-10-CM | POA: Diagnosis not present

## 2022-07-10 DIAGNOSIS — Z7901 Long term (current) use of anticoagulants: Secondary | ICD-10-CM | POA: Diagnosis not present

## 2022-07-10 DIAGNOSIS — R488 Other symbolic dysfunctions: Secondary | ICD-10-CM | POA: Diagnosis not present

## 2022-07-10 DIAGNOSIS — F0390 Unspecified dementia without behavioral disturbance: Secondary | ICD-10-CM | POA: Diagnosis not present

## 2022-07-10 DIAGNOSIS — F0393 Unspecified dementia, unspecified severity, with mood disturbance: Secondary | ICD-10-CM | POA: Diagnosis not present

## 2022-07-10 DIAGNOSIS — I480 Paroxysmal atrial fibrillation: Secondary | ICD-10-CM | POA: Diagnosis not present

## 2022-07-10 DIAGNOSIS — I129 Hypertensive chronic kidney disease with stage 1 through stage 4 chronic kidney disease, or unspecified chronic kidney disease: Secondary | ICD-10-CM | POA: Diagnosis not present

## 2022-07-10 DIAGNOSIS — D6869 Other thrombophilia: Secondary | ICD-10-CM | POA: Diagnosis not present

## 2022-07-10 DIAGNOSIS — E1122 Type 2 diabetes mellitus with diabetic chronic kidney disease: Secondary | ICD-10-CM | POA: Diagnosis not present

## 2022-07-22 ENCOUNTER — Other Ambulatory Visit (HOSPITAL_COMMUNITY): Payer: Self-pay | Admitting: *Deleted

## 2022-07-22 DIAGNOSIS — I129 Hypertensive chronic kidney disease with stage 1 through stage 4 chronic kidney disease, or unspecified chronic kidney disease: Secondary | ICD-10-CM | POA: Diagnosis not present

## 2022-07-22 DIAGNOSIS — Z7901 Long term (current) use of anticoagulants: Secondary | ICD-10-CM | POA: Diagnosis not present

## 2022-07-22 DIAGNOSIS — E1122 Type 2 diabetes mellitus with diabetic chronic kidney disease: Secondary | ICD-10-CM | POA: Diagnosis not present

## 2022-07-22 DIAGNOSIS — D6869 Other thrombophilia: Secondary | ICD-10-CM | POA: Diagnosis not present

## 2022-07-22 DIAGNOSIS — I480 Paroxysmal atrial fibrillation: Secondary | ICD-10-CM | POA: Diagnosis not present

## 2022-07-22 DIAGNOSIS — F0390 Unspecified dementia without behavioral disturbance: Secondary | ICD-10-CM | POA: Diagnosis not present

## 2022-07-22 DIAGNOSIS — M810 Age-related osteoporosis without current pathological fracture: Secondary | ICD-10-CM | POA: Diagnosis not present

## 2022-07-26 ENCOUNTER — Encounter (HOSPITAL_COMMUNITY)
Admission: RE | Admit: 2022-07-26 | Discharge: 2022-07-26 | Disposition: A | Discharge status: Home / Self Care | Payer: PPO | Source: Ambulatory Visit | Attending: Internal Medicine | Admitting: Internal Medicine

## 2022-07-26 ENCOUNTER — Ambulatory Visit (INDEPENDENT_AMBULATORY_CARE_PROVIDER_SITE_OTHER): Payer: PPO | Admitting: Podiatry

## 2022-07-26 ENCOUNTER — Encounter: Payer: Self-pay | Admitting: Podiatry

## 2022-07-26 VITALS — BP 145/44 | HR 71

## 2022-07-26 DIAGNOSIS — B351 Tinea unguium: Secondary | ICD-10-CM | POA: Diagnosis not present

## 2022-07-26 DIAGNOSIS — M79674 Pain in right toe(s): Secondary | ICD-10-CM | POA: Diagnosis not present

## 2022-07-26 DIAGNOSIS — M810 Age-related osteoporosis without current pathological fracture: Secondary | ICD-10-CM | POA: Diagnosis not present

## 2022-07-26 DIAGNOSIS — M79675 Pain in left toe(s): Secondary | ICD-10-CM

## 2022-07-26 MED ORDER — DENOSUMAB 60 MG/ML ~~LOC~~ SOSY
PREFILLED_SYRINGE | SUBCUTANEOUS | Status: AC
  Filled 2022-07-26: qty 1

## 2022-07-26 MED ORDER — DENOSUMAB 60 MG/ML ~~LOC~~ SOSY
60.0000 mg | PREFILLED_SYRINGE | Freq: Once | SUBCUTANEOUS | Status: AC
  Administered 2022-07-26: 60 mg via SUBCUTANEOUS

## 2022-07-27 ENCOUNTER — Encounter: Payer: Self-pay | Admitting: Podiatry

## 2022-07-27 NOTE — Progress Notes (Signed)
  Subjective:  Patient ID: Meredith Kramer, female    DOB: Jul 12, 1929,  MRN: [id number]  Chief Complaint  Patient presents with   Nail Problem    "Her toenails" N - toenails L - bilateral  D - 2 yrs ago O - gradually worse C - thick, diabetic, discolored A - none T - none    89 y.o. female presents with the above complaint. History confirmed with patient.  She is here with her daughter today.  Her toenails are thickened elongated causing pain and discomfort.  Objective:  Physical Exam: warm, good capillary refill, no trophic changes or ulcerative lesions, normal DP and PT pulses, and normal sensory exam. Left Foot: dystrophic yellowed discolored nail plates with subungual debris Right Foot: dystrophic yellowed discolored nail plates with subungual debris   Assessment:   1. Pain due to onychomycosis of toenails of both feet      Plan:  Patient was evaluated and treated and all questions answered.   Discussed the etiology and treatment options for the condition in detail with the patient.  I discussed with them that fungus at this age is quite difficult to get rid of and oral and topical medications are either ineffective or carry too much risk of side effects.. Recommended debridement of the nails today. Sharp and mechanical debridement performed of all painful and mycotic nails today. Nails debrided in length and thickness using a nail nipper to level of comfort. Discussed treatment options including appropriate shoe gear. Follow up as needed for painful nails.    Return in about 3 months (around 10/26/2022) for painful thick fungal nails.

## 2022-08-10 DIAGNOSIS — E119 Type 2 diabetes mellitus without complications: Secondary | ICD-10-CM | POA: Diagnosis not present

## 2022-08-17 DIAGNOSIS — M810 Age-related osteoporosis without current pathological fracture: Secondary | ICD-10-CM | POA: Diagnosis not present

## 2022-08-17 DIAGNOSIS — Z7901 Long term (current) use of anticoagulants: Secondary | ICD-10-CM | POA: Diagnosis not present

## 2022-08-17 DIAGNOSIS — R488 Other symbolic dysfunctions: Secondary | ICD-10-CM | POA: Diagnosis not present

## 2022-08-17 DIAGNOSIS — D6869 Other thrombophilia: Secondary | ICD-10-CM | POA: Diagnosis not present

## 2022-08-17 DIAGNOSIS — I480 Paroxysmal atrial fibrillation: Secondary | ICD-10-CM | POA: Diagnosis not present

## 2022-08-17 DIAGNOSIS — I129 Hypertensive chronic kidney disease with stage 1 through stage 4 chronic kidney disease, or unspecified chronic kidney disease: Secondary | ICD-10-CM | POA: Diagnosis not present

## 2022-08-17 DIAGNOSIS — E1122 Type 2 diabetes mellitus with diabetic chronic kidney disease: Secondary | ICD-10-CM | POA: Diagnosis not present

## 2022-08-17 DIAGNOSIS — F0390 Unspecified dementia without behavioral disturbance: Secondary | ICD-10-CM | POA: Diagnosis not present

## 2022-09-22 ENCOUNTER — Other Ambulatory Visit: Payer: Self-pay | Admitting: Internal Medicine

## 2022-09-22 DIAGNOSIS — Z1231 Encounter for screening mammogram for malignant neoplasm of breast: Secondary | ICD-10-CM

## 2022-09-23 DIAGNOSIS — E1122 Type 2 diabetes mellitus with diabetic chronic kidney disease: Secondary | ICD-10-CM | POA: Diagnosis not present

## 2022-09-23 DIAGNOSIS — I480 Paroxysmal atrial fibrillation: Secondary | ICD-10-CM | POA: Diagnosis not present

## 2022-09-23 DIAGNOSIS — D6869 Other thrombophilia: Secondary | ICD-10-CM | POA: Diagnosis not present

## 2022-09-23 DIAGNOSIS — R488 Other symbolic dysfunctions: Secondary | ICD-10-CM | POA: Diagnosis not present

## 2022-09-23 DIAGNOSIS — I129 Hypertensive chronic kidney disease with stage 1 through stage 4 chronic kidney disease, or unspecified chronic kidney disease: Secondary | ICD-10-CM | POA: Diagnosis not present

## 2022-09-23 DIAGNOSIS — F0390 Unspecified dementia without behavioral disturbance: Secondary | ICD-10-CM | POA: Diagnosis not present

## 2022-09-23 DIAGNOSIS — M810 Age-related osteoporosis without current pathological fracture: Secondary | ICD-10-CM | POA: Diagnosis not present

## 2022-09-23 DIAGNOSIS — Z7901 Long term (current) use of anticoagulants: Secondary | ICD-10-CM | POA: Diagnosis not present

## 2022-09-29 DIAGNOSIS — E1142 Type 2 diabetes mellitus with diabetic polyneuropathy: Secondary | ICD-10-CM | POA: Diagnosis not present

## 2022-09-29 DIAGNOSIS — F0390 Unspecified dementia without behavioral disturbance: Secondary | ICD-10-CM | POA: Diagnosis not present

## 2022-09-29 DIAGNOSIS — I129 Hypertensive chronic kidney disease with stage 1 through stage 4 chronic kidney disease, or unspecified chronic kidney disease: Secondary | ICD-10-CM | POA: Diagnosis not present

## 2022-09-29 DIAGNOSIS — D6869 Other thrombophilia: Secondary | ICD-10-CM | POA: Diagnosis not present

## 2022-09-29 DIAGNOSIS — E1122 Type 2 diabetes mellitus with diabetic chronic kidney disease: Secondary | ICD-10-CM | POA: Diagnosis not present

## 2022-09-29 DIAGNOSIS — M810 Age-related osteoporosis without current pathological fracture: Secondary | ICD-10-CM | POA: Diagnosis not present

## 2022-09-29 DIAGNOSIS — I480 Paroxysmal atrial fibrillation: Secondary | ICD-10-CM | POA: Diagnosis not present

## 2022-10-07 ENCOUNTER — Ambulatory Visit
Admission: RE | Admit: 2022-10-07 | Discharge: 2022-10-07 | Disposition: A | Discharge status: Home / Self Care | Payer: PPO | Source: Ambulatory Visit | Attending: Internal Medicine | Admitting: Internal Medicine

## 2022-10-07 DIAGNOSIS — Z1231 Encounter for screening mammogram for malignant neoplasm of breast: Secondary | ICD-10-CM

## 2022-11-01 ENCOUNTER — Encounter: Payer: Self-pay | Admitting: Podiatry

## 2022-11-01 ENCOUNTER — Ambulatory Visit (INDEPENDENT_AMBULATORY_CARE_PROVIDER_SITE_OTHER): Payer: PPO | Admitting: Podiatry

## 2022-11-01 VITALS — BP 136/50

## 2022-11-01 DIAGNOSIS — M79674 Pain in right toe(s): Secondary | ICD-10-CM

## 2022-11-01 DIAGNOSIS — M79675 Pain in left toe(s): Secondary | ICD-10-CM

## 2022-11-01 DIAGNOSIS — B351 Tinea unguium: Secondary | ICD-10-CM

## 2022-11-01 NOTE — Progress Notes (Signed)
  Subjective:  Patient ID: Meredith Kramer, female    DOB: 09/22/1929,  MRN: [id number]  Meredith Kramer presents to clinic today for painful elongated mycotic toenails 1-5 bilaterally which are tender when wearing enclosed shoe gear. Pain is relieved with periodic professional debridement.  Chief Complaint  Patient presents with   Nail Problem    RFC,Referring Provider Melida Quitter, MD,lov:05/24      New problem(s): None.   PCP is Melida Quitter, MD.  Allergies  Allergen Reactions   Doxycycline Hyclate     REACTION: rash   Erythromycin Base     REACTION: rash   Penicillins     Has patient had a PCN reaction causing immediate rash, facial/tongue/throat swelling, SOB or lightheadedness with hypotension: Unknown Has patient had a PCN reaction causing severe rash involving mucus membranes or skin necrosis: Unknown Has patient had a PCN reaction that required hospitalization: No Has patient had a PCN reaction occurring within the last 10 years: No If all of the above answers are "NO", then may proceed with Cephalosporin use.     Review of Systems: Negative except as noted in the HPI.  Objective: No changes noted in today's physical examination. Vitals:   11/01/22 1403 11/01/22 1411  BP: (!) 152/50 (!) 136/50   KEILEIGH Kramer is a pleasant 89 y.o. female WD, WN in NAD. AAO x 3.  Vascular Examination: Capillary refill time immediate b/l. Vascular status intact b/l with palpable pedal pulses. Pedal hair present b/l. No pain with calf compression b/l. Skin temperature gradient WNL b/l. No cyanosis or clubbing b/l. No ischemia or gangrene noted b/l.   Neurological Examination: Sensation grossly intact b/l with 10 gram monofilament. Vibratory sensation intact b/l.   Dermatological Examination: Pedal skin with normal turgor, texture and tone b/l.  No open wounds. No interdigital macerations.   Toenails 1-5 b/l thick, discolored, elongated with subungual debris and pain on  dorsal palpation.   Left great toe with porokeratosis at distal edge of digit. No erythema, no edema, no drainage, no fluctuance.  Musculoskeletal Examination: Normal muscle strength 5/5 to all lower extremity muscle groups bilaterally. No pain, crepitus or joint limitation noted with ROM b/l LE. No gross bony pedal deformities b/l. Patient ambulates independently without assistive aids.  Radiographs: None  Assessment/Plan: No diagnosis found.  -Patient was evaluated and treated. All patient's and/or POA's questions/concerns answered on today's visit. -Porokeratotic lesion removed distal tip left great toe. Triple antibiotic ointment applied. No further treatment required. -Toenails were debrided in length and girth 1-5 bilaterally with sterile nail nippers and dremel without iatrogenic bleeding.  -Patient/POA to call should there be question/concern in the interim.   Return in about 3 months (around 02/01/2023).  Freddie Breech, DPM

## 2022-11-04 IMAGING — MG MM DIGITAL DIAGNOSTIC UNILAT*R* W/ TOMO W/ CAD
4 series · 4 of 12 positions shown · non-contrast
Comparison: Previous exam(s).

CLINICAL DATA: Short-term interval follow-up of a probable benign
asymmetry in the right breast.

EXAM:
DIGITAL DIAGNOSTIC UNILATERAL RIGHT MAMMOGRAM WITH TOMOSYNTHESIS AND
CAD
TECHNIQUE: Right digital diagnostic mammography and breast tomosynthesis was
performed. The images were evaluated with computer-aided detection.

[R CC synth-2D]
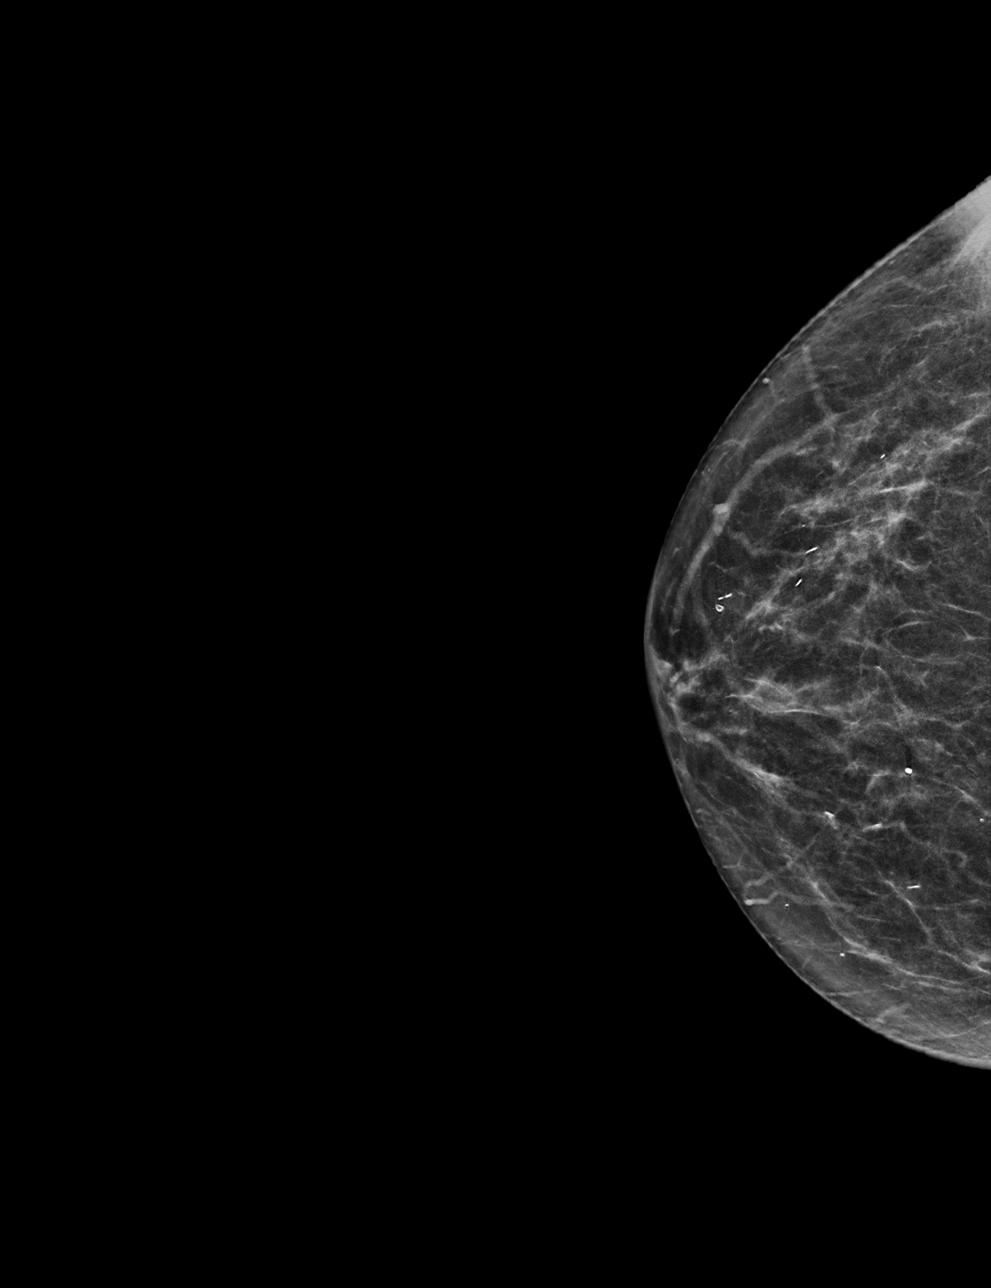

[R MLO synth-2D]
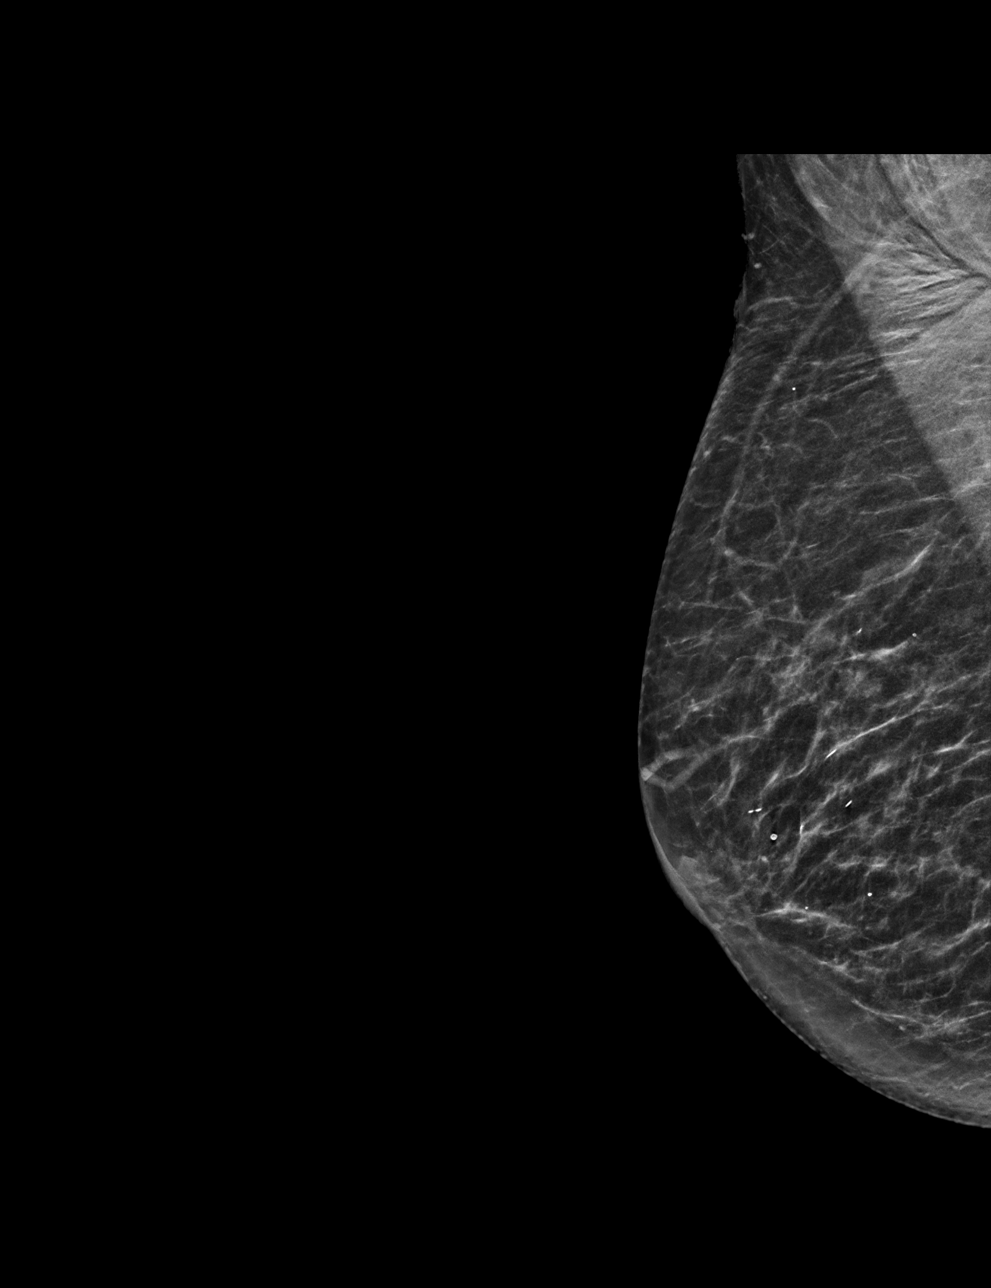

[R CC tomo · tomo slice 29/56.0]
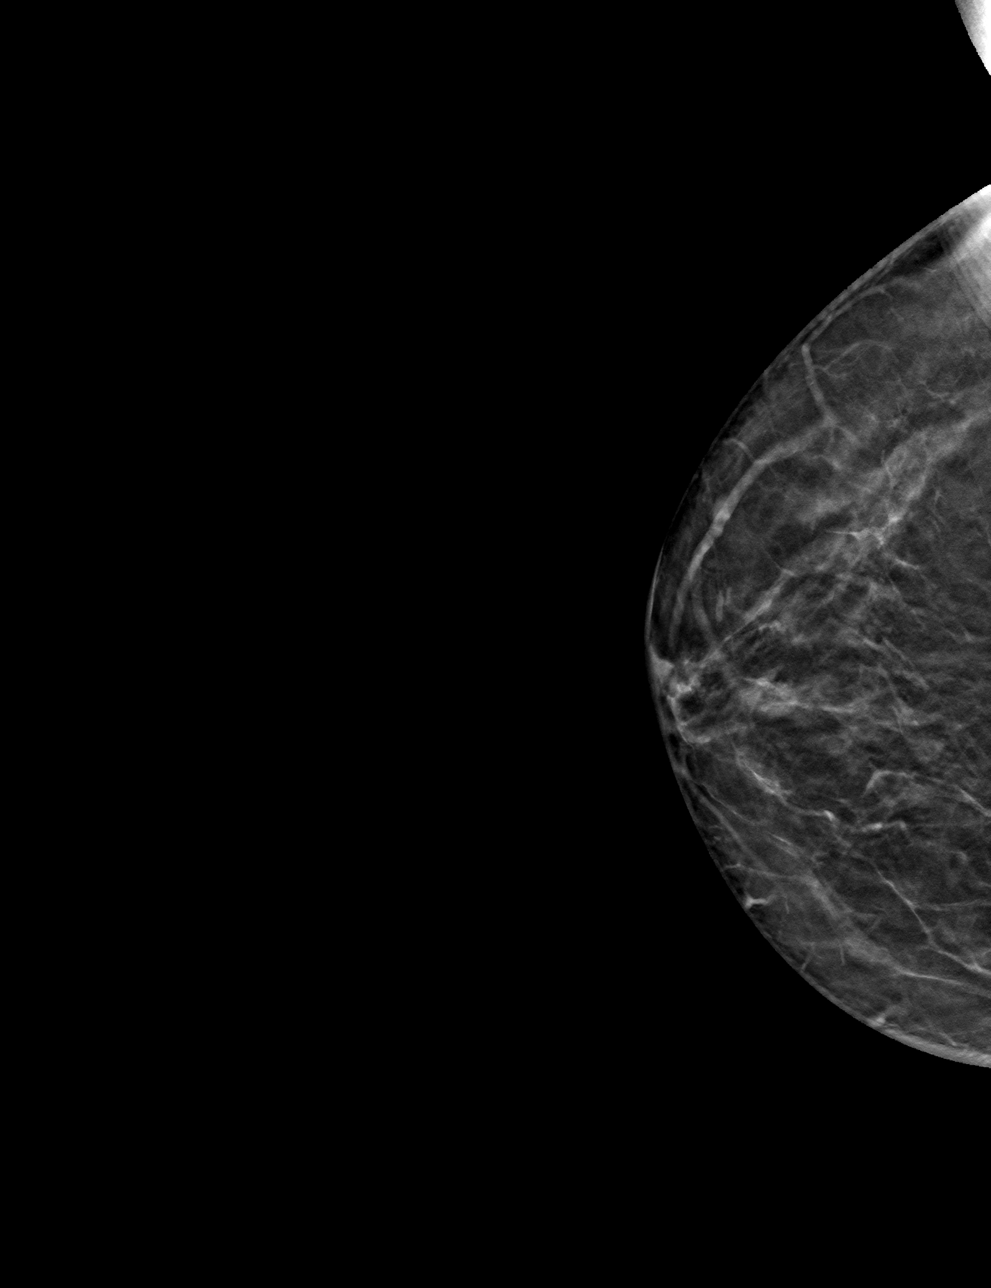

[R MLO tomo · tomo slice 29/57.0]
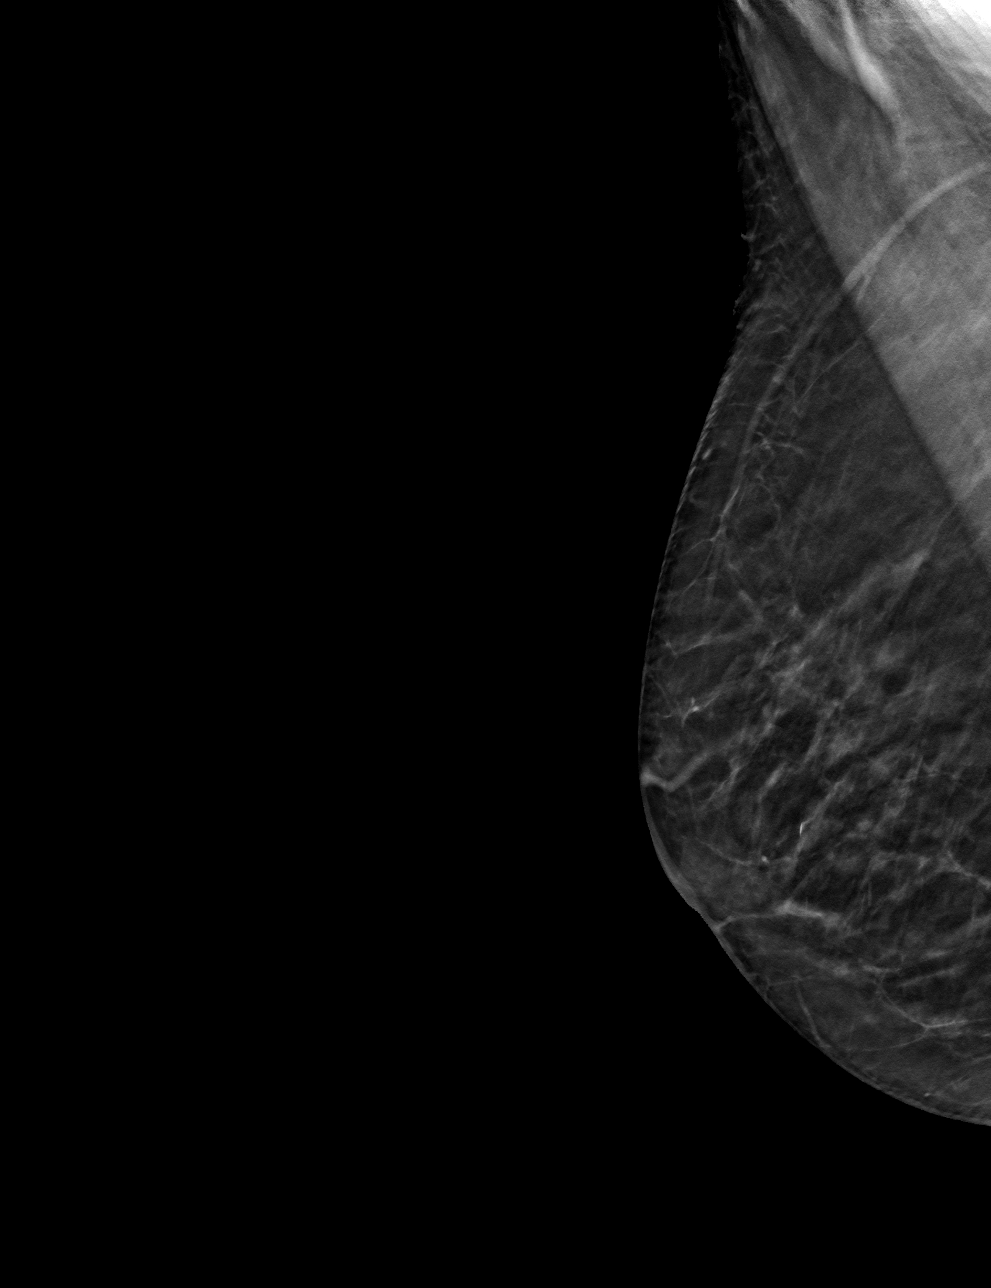

[4 of 12 positions shown; findings below may reference images not displayed]

ACR Breast Density Category b: There are scattered areas of
fibroglandular density.
FINDINGS: Asymmetry previously seen in the superior aspect of the right breast
is less apparent than the prior exam. No suspicious mass or
malignant type microcalcifications identified.
IMPRESSION: Probable benign right breast asymmetry.

RECOMMENDATION:
Bilateral diagnostic mammogram in 6 months is recommended to
complete 1 year follow-up of the right breast.

I have discussed the findings and recommendations with the patient.
If applicable, a reminder letter will be sent to the patient
regarding the next appointment.

BI-RADS CATEGORY  3: Probably benign.

## 2022-11-29 DIAGNOSIS — Z86006 Personal history of melanoma in-situ: Secondary | ICD-10-CM | POA: Diagnosis not present

## 2022-11-29 DIAGNOSIS — D225 Melanocytic nevi of trunk: Secondary | ICD-10-CM | POA: Diagnosis not present

## 2022-11-29 DIAGNOSIS — L853 Xerosis cutis: Secondary | ICD-10-CM | POA: Diagnosis not present

## 2022-11-29 DIAGNOSIS — Z85828 Personal history of other malignant neoplasm of skin: Secondary | ICD-10-CM | POA: Diagnosis not present

## 2022-11-29 DIAGNOSIS — D2261 Melanocytic nevi of right upper limb, including shoulder: Secondary | ICD-10-CM | POA: Diagnosis not present

## 2022-11-29 DIAGNOSIS — D2271 Melanocytic nevi of right lower limb, including hip: Secondary | ICD-10-CM | POA: Diagnosis not present

## 2022-11-29 DIAGNOSIS — Z85820 Personal history of malignant melanoma of skin: Secondary | ICD-10-CM | POA: Diagnosis not present

## 2023-01-21 DIAGNOSIS — E1122 Type 2 diabetes mellitus with diabetic chronic kidney disease: Secondary | ICD-10-CM | POA: Diagnosis not present

## 2023-01-21 DIAGNOSIS — N182 Chronic kidney disease, stage 2 (mild): Secondary | ICD-10-CM | POA: Diagnosis not present

## 2023-01-21 DIAGNOSIS — R7989 Other specified abnormal findings of blood chemistry: Secondary | ICD-10-CM | POA: Diagnosis not present

## 2023-01-21 DIAGNOSIS — E559 Vitamin D deficiency, unspecified: Secondary | ICD-10-CM | POA: Diagnosis not present

## 2023-01-21 DIAGNOSIS — I129 Hypertensive chronic kidney disease with stage 1 through stage 4 chronic kidney disease, or unspecified chronic kidney disease: Secondary | ICD-10-CM | POA: Diagnosis not present

## 2023-01-28 DIAGNOSIS — E559 Vitamin D deficiency, unspecified: Secondary | ICD-10-CM | POA: Diagnosis not present

## 2023-01-28 DIAGNOSIS — Z23 Encounter for immunization: Secondary | ICD-10-CM | POA: Diagnosis not present

## 2023-01-28 DIAGNOSIS — N182 Chronic kidney disease, stage 2 (mild): Secondary | ICD-10-CM | POA: Diagnosis not present

## 2023-01-28 DIAGNOSIS — F0390 Unspecified dementia without behavioral disturbance: Secondary | ICD-10-CM | POA: Diagnosis not present

## 2023-01-28 DIAGNOSIS — D6869 Other thrombophilia: Secondary | ICD-10-CM | POA: Diagnosis not present

## 2023-01-28 DIAGNOSIS — Z0000 Encounter for general adult medical examination without abnormal findings: Secondary | ICD-10-CM | POA: Diagnosis not present

## 2023-01-28 DIAGNOSIS — Z681 Body mass index (BMI) 19 or less, adult: Secondary | ICD-10-CM | POA: Diagnosis not present

## 2023-01-28 DIAGNOSIS — I480 Paroxysmal atrial fibrillation: Secondary | ICD-10-CM | POA: Diagnosis not present

## 2023-01-28 DIAGNOSIS — E1142 Type 2 diabetes mellitus with diabetic polyneuropathy: Secondary | ICD-10-CM | POA: Diagnosis not present

## 2023-01-28 DIAGNOSIS — E1122 Type 2 diabetes mellitus with diabetic chronic kidney disease: Secondary | ICD-10-CM | POA: Diagnosis not present

## 2023-01-28 DIAGNOSIS — I129 Hypertensive chronic kidney disease with stage 1 through stage 4 chronic kidney disease, or unspecified chronic kidney disease: Secondary | ICD-10-CM | POA: Diagnosis not present

## 2023-01-28 DIAGNOSIS — M810 Age-related osteoporosis without current pathological fracture: Secondary | ICD-10-CM | POA: Diagnosis not present

## 2023-02-03 ENCOUNTER — Other Ambulatory Visit (HOSPITAL_COMMUNITY): Payer: Self-pay | Admitting: *Deleted

## 2023-02-07 ENCOUNTER — Ambulatory Visit (HOSPITAL_COMMUNITY)
Admission: RE | Admit: 2023-02-07 | Discharge: 2023-02-07 | Disposition: A | Discharge status: Home / Self Care | Payer: PPO | Source: Ambulatory Visit | Attending: Internal Medicine | Admitting: Internal Medicine

## 2023-02-07 DIAGNOSIS — M810 Age-related osteoporosis without current pathological fracture: Secondary | ICD-10-CM | POA: Insufficient documentation

## 2023-02-07 MED ORDER — DENOSUMAB 60 MG/ML ~~LOC~~ SOSY
PREFILLED_SYRINGE | SUBCUTANEOUS | Status: AC
  Filled 2023-02-07: qty 1

## 2023-02-07 MED ORDER — DENOSUMAB 60 MG/ML ~~LOC~~ SOSY
60.0000 mg | PREFILLED_SYRINGE | Freq: Once | SUBCUTANEOUS | Status: AC
  Administered 2023-02-07: 60 mg via SUBCUTANEOUS

## 2023-03-28 DIAGNOSIS — R29890 Loss of height: Secondary | ICD-10-CM | POA: Diagnosis not present

## 2023-03-28 DIAGNOSIS — M8588 Other specified disorders of bone density and structure, other site: Secondary | ICD-10-CM | POA: Diagnosis not present

## 2023-03-28 DIAGNOSIS — Z8262 Family history of osteoporosis: Secondary | ICD-10-CM | POA: Diagnosis not present

## 2023-07-22 DIAGNOSIS — E1142 Type 2 diabetes mellitus with diabetic polyneuropathy: Secondary | ICD-10-CM | POA: Diagnosis not present

## 2023-07-22 DIAGNOSIS — E1122 Type 2 diabetes mellitus with diabetic chronic kidney disease: Secondary | ICD-10-CM | POA: Diagnosis not present

## 2023-07-22 DIAGNOSIS — N182 Chronic kidney disease, stage 2 (mild): Secondary | ICD-10-CM | POA: Diagnosis not present

## 2023-07-22 DIAGNOSIS — I480 Paroxysmal atrial fibrillation: Secondary | ICD-10-CM | POA: Diagnosis not present

## 2023-07-22 DIAGNOSIS — Z79899 Other long term (current) drug therapy: Secondary | ICD-10-CM | POA: Diagnosis not present

## 2023-07-22 DIAGNOSIS — D6869 Other thrombophilia: Secondary | ICD-10-CM | POA: Diagnosis not present

## 2023-07-22 DIAGNOSIS — C439 Malignant melanoma of skin, unspecified: Secondary | ICD-10-CM | POA: Diagnosis not present

## 2023-07-22 DIAGNOSIS — M810 Age-related osteoporosis without current pathological fracture: Secondary | ICD-10-CM | POA: Diagnosis not present

## 2023-07-22 DIAGNOSIS — F0390 Unspecified dementia without behavioral disturbance: Secondary | ICD-10-CM | POA: Diagnosis not present

## 2023-07-22 DIAGNOSIS — I129 Hypertensive chronic kidney disease with stage 1 through stage 4 chronic kidney disease, or unspecified chronic kidney disease: Secondary | ICD-10-CM | POA: Diagnosis not present

## 2023-10-20 DIAGNOSIS — M810 Age-related osteoporosis without current pathological fracture: Secondary | ICD-10-CM | POA: Diagnosis not present

## 2023-10-20 DIAGNOSIS — N182 Chronic kidney disease, stage 2 (mild): Secondary | ICD-10-CM | POA: Diagnosis not present

## 2023-10-20 DIAGNOSIS — E1122 Type 2 diabetes mellitus with diabetic chronic kidney disease: Secondary | ICD-10-CM | POA: Diagnosis not present

## 2023-10-20 DIAGNOSIS — F0390 Unspecified dementia without behavioral disturbance: Secondary | ICD-10-CM | POA: Diagnosis not present

## 2023-10-20 DIAGNOSIS — I480 Paroxysmal atrial fibrillation: Secondary | ICD-10-CM | POA: Diagnosis not present

## 2023-10-20 DIAGNOSIS — I129 Hypertensive chronic kidney disease with stage 1 through stage 4 chronic kidney disease, or unspecified chronic kidney disease: Secondary | ICD-10-CM | POA: Diagnosis not present

## 2023-10-20 DIAGNOSIS — C439 Malignant melanoma of skin, unspecified: Secondary | ICD-10-CM | POA: Diagnosis not present

## 2023-12-19 DIAGNOSIS — L821 Other seborrheic keratosis: Secondary | ICD-10-CM | POA: Diagnosis not present

## 2023-12-19 DIAGNOSIS — L853 Xerosis cutis: Secondary | ICD-10-CM | POA: Diagnosis not present

## 2023-12-19 DIAGNOSIS — L820 Inflamed seborrheic keratosis: Secondary | ICD-10-CM | POA: Diagnosis not present

## 2023-12-19 DIAGNOSIS — Z85820 Personal history of malignant melanoma of skin: Secondary | ICD-10-CM | POA: Diagnosis not present

## 2023-12-19 DIAGNOSIS — Z85828 Personal history of other malignant neoplasm of skin: Secondary | ICD-10-CM | POA: Diagnosis not present

## 2023-12-19 DIAGNOSIS — L720 Epidermal cyst: Secondary | ICD-10-CM | POA: Diagnosis not present

## 2023-12-19 DIAGNOSIS — D2272 Melanocytic nevi of left lower limb, including hip: Secondary | ICD-10-CM | POA: Diagnosis not present

## 2023-12-19 DIAGNOSIS — D225 Melanocytic nevi of trunk: Secondary | ICD-10-CM | POA: Diagnosis not present

## 2023-12-19 DIAGNOSIS — D2271 Melanocytic nevi of right lower limb, including hip: Secondary | ICD-10-CM | POA: Diagnosis not present

## 2023-12-22 ENCOUNTER — Ambulatory Visit: Admitting: Podiatry

## 2023-12-22 ENCOUNTER — Encounter: Payer: Self-pay | Admitting: Podiatry

## 2023-12-22 DIAGNOSIS — M79674 Pain in right toe(s): Secondary | ICD-10-CM | POA: Diagnosis not present

## 2023-12-22 DIAGNOSIS — M79675 Pain in left toe(s): Secondary | ICD-10-CM

## 2023-12-22 DIAGNOSIS — B351 Tinea unguium: Secondary | ICD-10-CM | POA: Diagnosis not present

## 2023-12-26 ENCOUNTER — Encounter: Payer: Self-pay | Admitting: Podiatry

## 2023-12-26 NOTE — Progress Notes (Signed)
  Subjective:  Patient ID: Meredith Kramer, female    DOB: 1929/07/07,  MRN: [id number]  NICOLLETTE WILHELMI presents to clinic today for painful thick toenails that are difficult to trim. Pain interferes with ambulation. Aggravating factors include wearing enclosed shoe gear. Pain is relieved with periodic professional debridement.  Chief Complaint  Patient presents with   RFC    Rm1 Routine foot care/Dr. Stephane last visit March 2025   New problem(s): None.   PCP is Stephane Leita DEL, MD.  Allergies  Allergen Reactions   Doxycycline Hyclate     REACTION: rash   Erythromycin Base     REACTION: rash   Penicillins     Has patient had a PCN reaction causing immediate rash, facial/tongue/throat swelling, SOB or lightheadedness with hypotension: Unknown Has patient had a PCN reaction causing severe rash involving mucus membranes or skin necrosis: Unknown Has patient had a PCN reaction that required hospitalization: No Has patient had a PCN reaction occurring within the last 10 years: No If all of the above answers are NO, then may proceed with Cephalosporin use.     Review of Systems: Negative except as noted in the HPI.  Objective: No changes noted in today's physical examination. There were no vitals filed for this visit. Meredith Kramer is a pleasant 89 y.o. female thin build in NAD. AAO x 3.  Vascular Examination: Capillary refill time immediate b/l. Vascular status intact b/l with palpable pedal pulses. Pedal hair present b/l. No pain with calf compression b/l. Skin temperature gradient WNL b/l. No cyanosis or clubbing b/l. No ischemia or gangrene noted b/l. No edema noted b/l LE.  Neurological Examination: Sensation grossly intact b/l with 10 gram monofilament. Vibratory sensation intact b/l.   Dermatological Examination: Pedal skin with normal turgor, texture and tone b/l.  No open wounds. No interdigital macerations.   Toenails 1-5 b/l thick, discolored, elongated with  subungual debris and pain on dorsal palpation.   No hyperkeratotic nor porokeratotic lesions.  Musculoskeletal Examination: Muscle strength 5/5 to all lower extremity muscle groups bilaterally. No pain, crepitus or joint limitation noted with ROM b/l LE. No gross bony pedal deformities b/l. Patient ambulates independently without assistive aids.  Radiographs: None  Assessment/Plan: 1. Pain due to onychomycosis of toenails of both feet   Consent given for treatment. Patient examined. All patient's and/or POA's questions/concerns addressed on today's visit. Mycotic toenails 1-5 debrided in length and girth without incident. Continue soft, supportive shoe gear daily. Report any pedal injuries to medical professional. Call office if there are any quesitons/concerns. -Patient/POA to call should there be question/concern in the interim.   Return in about 3 months (around 03/23/2024).  Delon LITTIE Merlin, DPM      [facility] LOCATION: [address] [place] [address]                   Office [phone]   [facility] LOCATION: [address] [place], [place] [address] Office [phone]
